# Patient Record
Sex: Male | Born: 1965 | Hispanic: Yes | Marital: Married
Health system: Southern US, Community
[De-identification: ages and names within clinical notes are randomized; demographics above are authoritative.]

---

## 2019-11-18 ENCOUNTER — Inpatient Hospital Stay: Payer: Medicaid Other

## 2019-11-18 ENCOUNTER — Emergency Department: Payer: Medicaid Other

## 2019-11-18 ENCOUNTER — Inpatient Hospital Stay
Admission: EM | Admit: 2019-11-18 | Discharge: 2019-11-24 | DRG: 296 | Disposition: E | Payer: Medicaid Other | Attending: Internal Medicine | Admitting: Internal Medicine

## 2019-11-18 DIAGNOSIS — I469 Cardiac arrest, cause unspecified: Secondary | ICD-10-CM | POA: Diagnosis present

## 2019-11-18 DIAGNOSIS — Z20822 Contact with and (suspected) exposure to covid-19: Secondary | ICD-10-CM | POA: Diagnosis present

## 2019-11-18 DIAGNOSIS — E870 Hyperosmolality and hypernatremia: Secondary | ICD-10-CM | POA: Diagnosis present

## 2019-11-18 DIAGNOSIS — R9431 Abnormal electrocardiogram [ECG] [EKG]: Secondary | ICD-10-CM | POA: Diagnosis present

## 2019-11-18 DIAGNOSIS — E872 Acidosis: Secondary | ICD-10-CM | POA: Diagnosis present

## 2019-11-18 DIAGNOSIS — N179 Acute kidney failure, unspecified: Secondary | ICD-10-CM | POA: Diagnosis present

## 2019-11-18 DIAGNOSIS — T68XXXA Hypothermia, initial encounter: Secondary | ICD-10-CM | POA: Diagnosis present

## 2019-11-18 DIAGNOSIS — J9602 Acute respiratory failure with hypercapnia: Secondary | ICD-10-CM | POA: Diagnosis present

## 2019-11-18 DIAGNOSIS — Z21 Asymptomatic human immunodeficiency virus [HIV] infection status: Secondary | ICD-10-CM | POA: Diagnosis present

## 2019-11-18 DIAGNOSIS — J690 Pneumonitis due to inhalation of food and vomit: Secondary | ICD-10-CM | POA: Diagnosis present

## 2019-11-18 DIAGNOSIS — I255 Ischemic cardiomyopathy: Secondary | ICD-10-CM | POA: Diagnosis present

## 2019-11-18 DIAGNOSIS — Z66 Do not resuscitate: Secondary | ICD-10-CM | POA: Diagnosis not present

## 2019-11-18 DIAGNOSIS — R739 Hyperglycemia, unspecified: Secondary | ICD-10-CM | POA: Diagnosis present

## 2019-11-18 DIAGNOSIS — Z681 Body mass index (BMI) 19 or less, adult: Secondary | ICD-10-CM

## 2019-11-18 DIAGNOSIS — D62 Acute posthemorrhagic anemia: Secondary | ICD-10-CM | POA: Diagnosis present

## 2019-11-18 DIAGNOSIS — K7290 Hepatic failure, unspecified without coma: Secondary | ICD-10-CM | POA: Diagnosis present

## 2019-11-18 DIAGNOSIS — I959 Hypotension, unspecified: Secondary | ICD-10-CM

## 2019-11-18 DIAGNOSIS — G939 Disorder of brain, unspecified: Secondary | ICD-10-CM

## 2019-11-18 DIAGNOSIS — E43 Unspecified severe protein-calorie malnutrition: Secondary | ICD-10-CM | POA: Diagnosis present

## 2019-11-18 DIAGNOSIS — G9341 Metabolic encephalopathy: Secondary | ICD-10-CM | POA: Diagnosis present

## 2019-11-18 DIAGNOSIS — I5021 Acute systolic (congestive) heart failure: Secondary | ICD-10-CM | POA: Diagnosis present

## 2019-11-18 DIAGNOSIS — X58XXXA Exposure to other specified factors, initial encounter: Secondary | ICD-10-CM | POA: Diagnosis present

## 2019-11-18 DIAGNOSIS — K922 Gastrointestinal hemorrhage, unspecified: Secondary | ICD-10-CM | POA: Diagnosis present

## 2019-11-18 DIAGNOSIS — Z515 Encounter for palliative care: Secondary | ICD-10-CM | POA: Diagnosis not present

## 2019-11-18 DIAGNOSIS — Z79899 Other long term (current) drug therapy: Secondary | ICD-10-CM

## 2019-11-18 DIAGNOSIS — K219 Gastro-esophageal reflux disease without esophagitis: Secondary | ICD-10-CM | POA: Diagnosis present

## 2019-11-18 DIAGNOSIS — R570 Cardiogenic shock: Secondary | ICD-10-CM | POA: Diagnosis present

## 2019-11-18 DIAGNOSIS — Z87820 Personal history of traumatic brain injury: Secondary | ICD-10-CM

## 2019-11-18 DIAGNOSIS — Z7189 Other specified counseling: Secondary | ICD-10-CM

## 2019-11-18 DIAGNOSIS — J9601 Acute respiratory failure with hypoxia: Secondary | ICD-10-CM | POA: Diagnosis present

## 2019-11-18 DIAGNOSIS — G931 Anoxic brain damage, not elsewhere classified: Secondary | ICD-10-CM | POA: Diagnosis present

## 2019-11-18 DIAGNOSIS — G9382 Brain death: Secondary | ICD-10-CM | POA: Diagnosis present

## 2019-11-18 DIAGNOSIS — D696 Thrombocytopenia, unspecified: Secondary | ICD-10-CM | POA: Diagnosis present

## 2019-11-18 DIAGNOSIS — Z9889 Other specified postprocedural states: Secondary | ICD-10-CM

## 2019-11-18 DIAGNOSIS — E875 Hyperkalemia: Secondary | ICD-10-CM | POA: Diagnosis present

## 2019-11-18 DIAGNOSIS — R578 Other shock: Secondary | ICD-10-CM | POA: Diagnosis present

## 2019-11-18 LAB — BLOOD GAS, ARTERIAL
Acid-base deficit: 23.9 mmol/L — ABNORMAL HIGH (ref 0.0–2.0)
Acid-base deficit: 10.9 mmol/L — ABNORMAL HIGH (ref 0.0–2.0)
Bicarbonate: 9.7 mmol/L — ABNORMAL LOW (ref 20.0–28.0)
Bicarbonate: 15.9 mmol/L — ABNORMAL LOW (ref 20.0–28.0)
FIO2: 1
FIO2: 1
MECHVT: 500 mL
MECHVT: 500 mL
Mechanical Rate: 16
Mechanical Rate: 18
O2 Saturation: 4.8 %
O2 Saturation: 100 %
PEEP: 10 cmH2O
PEEP: 10 cmH2O
Patient temperature: 37
Patient temperature: 37
RATE: 16 resp/min
pCO2 arterial: 53 mmHg — ABNORMAL HIGH (ref 32.0–48.0)
pCO2 arterial: 38 mmHg (ref 32.0–48.0)
pH, Arterial: 6.87 — CL (ref 7.350–7.450)
pH, Arterial: 7.23 — ABNORMAL LOW (ref 7.350–7.450)
pO2, Arterial: 13 mmHg — CL (ref 83.0–108.0)
pO2, Arterial: 470 mmHg — ABNORMAL HIGH (ref 83.0–108.0)

## 2019-11-18 LAB — CBC WITH DIFFERENTIAL/PLATELET
Abs Immature Granulocytes: 0.02 10*3/uL (ref 0.00–0.07)
Basophils Absolute: 0 10*3/uL (ref 0.0–0.1)
Basophils Relative: 0 %
Eosinophils Absolute: 0 10*3/uL (ref 0.0–0.5)
Eosinophils Relative: 0 %
HCT: 16.5 % — ABNORMAL LOW (ref 39.0–52.0)
Hemoglobin: 5.2 g/dL — ABNORMAL LOW (ref 13.0–17.0)
Immature Granulocytes: 1 %
Lymphocytes Relative: 50 %
Lymphs Abs: 2.2 10*3/uL (ref 0.7–4.0)
MCH: 31.9 pg (ref 26.0–34.0)
MCHC: 31.5 g/dL (ref 30.0–36.0)
MCV: 101.2 fL — ABNORMAL HIGH (ref 80.0–100.0)
Monocytes Absolute: 0.4 10*3/uL (ref 0.1–1.0)
Monocytes Relative: 9 %
Neutro Abs: 1.7 10*3/uL (ref 1.7–7.7)
Neutrophils Relative %: 40 %
Platelets: 96 10*3/uL — ABNORMAL LOW (ref 150–400)
RBC: 1.63 MIL/uL — ABNORMAL LOW (ref 4.22–5.81)
RDW: 17.5 % — ABNORMAL HIGH (ref 11.5–15.5)
Smear Review: DECREASED
WBC: 4.3 10*3/uL (ref 4.0–10.5)
nRBC: 0.9 % — ABNORMAL HIGH (ref 0.0–0.2)

## 2019-11-18 LAB — INFLUENZA PANEL BY PCR (TYPE A & B)
Influenza A By PCR: NEGATIVE
Influenza B By PCR: NEGATIVE

## 2019-11-18 LAB — COMPREHENSIVE METABOLIC PANEL
ALT: 149 U/L — ABNORMAL HIGH (ref 0–44)
AST: 373 U/L — ABNORMAL HIGH (ref 15–41)
Albumin: 2.5 g/dL — ABNORMAL LOW (ref 3.5–5.0)
Alkaline Phosphatase: 182 U/L — ABNORMAL HIGH (ref 38–126)
Anion gap: 19 — ABNORMAL HIGH (ref 5–15)
BUN: 44 mg/dL — ABNORMAL HIGH (ref 6–20)
CO2: 19 mmol/L — ABNORMAL LOW (ref 22–32)
Calcium: 7.8 mg/dL — ABNORMAL LOW (ref 8.9–10.3)
Chloride: 126 mmol/L — ABNORMAL HIGH (ref 98–111)
Creatinine, Ser: 2 mg/dL — ABNORMAL HIGH (ref 0.61–1.24)
GFR calc Af Amer: 43 mL/min — ABNORMAL LOW (ref >60)
GFR calc non Af Amer: 37 mL/min — ABNORMAL LOW (ref >60)
Glucose, Bld: 319 mg/dL — ABNORMAL HIGH (ref 70–99)
Potassium: 5.4 mmol/L — ABNORMAL HIGH (ref 3.5–5.1)
Sodium: 164 mmol/L (ref 135–145)
Total Bilirubin: 0.9 mg/dL (ref 0.3–1.2)
Total Protein: 5.3 g/dL — ABNORMAL LOW (ref 6.5–8.1)

## 2019-11-18 LAB — POC SARS CORONAVIRUS 2 AG: SARS Coronavirus 2 Ag: NEGATIVE

## 2019-11-18 LAB — GLUCOSE, CAPILLARY
Glucose-Capillary: 238 mg/dL — ABNORMAL HIGH (ref 70–99)
Glucose-Capillary: 354 mg/dL — ABNORMAL HIGH (ref 70–99)

## 2019-11-18 LAB — MRSA PCR SCREENING: MRSA by PCR: NEGATIVE

## 2019-11-18 LAB — ABO/RH: ABO/RH(D): A POS

## 2019-11-18 LAB — TROPONIN I (HIGH SENSITIVITY): Troponin I (High Sensitivity): 124 ng/L (ref <18)

## 2019-11-18 MED ORDER — INSULIN ASPART 100 UNIT/ML IV SOLN
10.0000 [IU] | INTRAVENOUS | Status: AC
  Administered 2019-11-18: 10 [IU] via INTRAVENOUS
  Filled 2019-11-18: qty 0.1

## 2019-11-18 MED ORDER — HEPARIN SODIUM (PORCINE) 5000 UNIT/ML IJ SOLN: 5000.0000 [IU] | Freq: Three times a day (TID) | INTRAMUSCULAR | Status: DC

## 2019-11-18 MED ORDER — SODIUM CHLORIDE 0.9 % IV SOLN: 250.0000 mL | INTRAVENOUS | Status: DC | PRN

## 2019-11-18 MED ORDER — PANTOPRAZOLE SODIUM 40 MG IV SOLR
40.0000 mg | INTRAVENOUS | Status: AC
  Filled 2019-11-18: qty 40

## 2019-11-18 MED ORDER — SODIUM CHLORIDE 0.9% IV SOLUTION: Freq: Once | INTRAVENOUS | Status: DC

## 2019-11-18 MED ORDER — ALBUTEROL SULFATE (2.5 MG/3ML) 0.083% IN NEBU: 2.5000 mg | INHALATION_SOLUTION | RESPIRATORY_TRACT | Status: DC | PRN

## 2019-11-18 MED ORDER — EPINEPHRINE PF 1 MG/ML IJ SOLN
1.0000 mg | Freq: Once | INTRAMUSCULAR | Status: AC
  Administered 2019-11-18: 1 mg via INTRAVENOUS

## 2019-11-18 MED ORDER — DEXTROSE 50 % IV SOLN: 12.5000 g | INTRAVENOUS | Status: AC

## 2019-11-18 MED ORDER — ONDANSETRON HCL 4 MG/2ML IJ SOLN: 4.0000 mg | Freq: Four times a day (QID) | INTRAMUSCULAR | Status: DC | PRN

## 2019-11-18 MED ORDER — INSULIN ASPART 100 UNIT/ML ~~LOC~~ SOLN
0.0000 [IU] | SUBCUTANEOUS | Status: DC
  Administered 2019-11-19: 1 [IU] via SUBCUTANEOUS
  Administered 2019-11-19: 2 [IU] via SUBCUTANEOUS
  Filled 2019-11-18 (×2): qty 1

## 2019-11-18 MED ORDER — VASOPRESSIN 20 UNIT/ML IV SOLN
0.0300 [IU]/min | INTRAVENOUS | Status: DC
  Administered 2019-11-18: 0.03 [IU]/min via INTRAVENOUS
  Filled 2019-11-18: qty 2

## 2019-11-18 MED ORDER — SODIUM CHLORIDE 0.9 % IV SOLN: 250.0000 mL | INTRAVENOUS | Status: DC

## 2019-11-18 MED ORDER — STERILE WATER FOR INJECTION IV SOLN: INTRAVENOUS | Status: DC

## 2019-11-18 MED ORDER — FREE WATER
200.0000 mL | Status: DC
  Administered 2019-11-19 (×4): 200 mL

## 2019-11-18 MED ORDER — VITAMIN K1 10 MG/ML IJ SOLN
10.0000 mg | Freq: Once | INTRAVENOUS | Status: AC
  Administered 2019-11-19: 10 mg via INTRAVENOUS
  Filled 2019-11-18: qty 1

## 2019-11-18 MED ORDER — SODIUM CHLORIDE 0.9 % IV SOLN
3.0000 g | Freq: Once | INTRAVENOUS | Status: DC
  Filled 2019-11-18: qty 8

## 2019-11-18 MED ORDER — FAMOTIDINE IN NACL 20-0.9 MG/50ML-% IV SOLN: 20.0000 mg | Freq: Two times a day (BID) | INTRAVENOUS | Status: DC

## 2019-11-18 MED ORDER — SODIUM CHLORIDE 0.9 % IV SOLN: 10.0000 mL/h | Freq: Once | INTRAVENOUS | Status: DC

## 2019-11-18 MED ORDER — SODIUM BICARBONATE 8.4 % IV SOLN
50.0000 mL | Freq: Once | INTRAVENOUS | Status: AC
  Administered 2019-11-18: 50 mL via INTRAVENOUS

## 2019-11-18 MED ORDER — PIPERACILLIN-TAZOBACTAM 3.375 G IVPB
3.3750 g | Freq: Three times a day (TID) | INTRAVENOUS | Status: DC
  Administered 2019-11-18 – 2019-11-19 (×2): 3.375 g via INTRAVENOUS
  Filled 2019-11-18 (×2): qty 50

## 2019-11-18 MED ORDER — SODIUM CHLORIDE 0.9% FLUSH: 3.0000 mL | INTRAVENOUS | Status: DC | PRN

## 2019-11-18 MED ORDER — STERILE WATER FOR INJECTION IV SOLN
INTRAVENOUS | Status: DC
  Filled 2019-11-18 (×4): qty 850

## 2019-11-18 MED ORDER — PHENYLEPHRINE CONCENTRATED 100MG/250ML (0.4 MG/ML) INFUSION SIMPLE
0.0000 ug/min | INTRAVENOUS | Status: DC
  Administered 2019-11-19: 200 ug/min via INTRAVENOUS
  Administered 2019-11-19: 100 ug/min via INTRAVENOUS
  Administered 2019-11-19: 200 ug/min via INTRAVENOUS
  Filled 2019-11-18 (×3): qty 250

## 2019-11-18 MED ORDER — SODIUM CHLORIDE 0.9 % IV BOLUS
1000.0000 mL | Freq: Once | INTRAVENOUS | Status: AC
  Administered 2019-11-18: 1000 mL via INTRAVENOUS

## 2019-11-18 MED ORDER — NOREPINEPHRINE 4 MG/250ML-% IV SOLN
2.0000 ug/min | INTRAVENOUS | Status: DC
  Administered 2019-11-18: 2 ug/min via INTRAVENOUS
  Filled 2019-11-18: qty 250

## 2019-11-18 MED ORDER — ACETAMINOPHEN 325 MG PO TABS: 650.0000 mg | ORAL_TABLET | ORAL | Status: DC | PRN

## 2019-11-18 MED ORDER — SODIUM CHLORIDE 0.9% FLUSH
3.0000 mL | Freq: Two times a day (BID) | INTRAVENOUS | Status: DC
  Administered 2019-11-19: 3 mL via INTRAVENOUS

## 2019-11-18 MED ORDER — VASOPRESSIN 20 UNIT/ML IV SOLN
0.2000 [IU]/min | INTRAVENOUS | Status: DC
  Administered 2019-11-19 (×2): 0.8 [IU]/min via INTRAVENOUS
  Administered 2019-11-19: 0.6 [IU]/min via INTRAVENOUS
  Filled 2019-11-18 (×4): qty 5

## 2019-11-18 MED ORDER — SODIUM BICARBONATE 8.4 % IV SOLN
100.0000 meq | Freq: Once | INTRAVENOUS | Status: AC
  Administered 2019-11-18: 100 meq via INTRAVENOUS

## 2019-11-18 MED ORDER — NOREPINEPHRINE 16 MG/250ML-% IV SOLN
0.0000 ug/min | INTRAVENOUS | Status: DC
  Administered 2019-11-18: 30 ug/min via INTRAVENOUS
  Administered 2019-11-19: 50 ug/min via INTRAVENOUS
  Administered 2019-11-19: 40 ug/min via INTRAVENOUS
  Administered 2019-11-19: 50 ug/min via INTRAVENOUS
  Filled 2019-11-18 (×5): qty 250

## 2019-11-18 MED ORDER — POLYVINYL ALCOHOL 1.4 % OP SOLN
2.0000 [drp] | Freq: Four times a day (QID) | OPHTHALMIC | Status: DC | PRN
  Filled 2019-11-18: qty 15

## 2019-11-18 MED ORDER — CALCIUM CHLORIDE 10 % IV SOLN
1.0000 g | Freq: Once | INTRAVENOUS | Status: AC
  Administered 2019-11-18: 1 g via INTRAVENOUS

## 2019-11-18 MED ORDER — SODIUM CHLORIDE 0.9 % IV SOLN
3.0000 g | Freq: Four times a day (QID) | INTRAVENOUS | Status: DC
  Filled 2019-11-18 (×3): qty 8

## 2019-11-18 MED ORDER — SODIUM CHLORIDE 0.9 % IV SOLN
8.0000 mg/h | INTRAVENOUS | Status: DC
  Administered 2019-11-19 (×2): 8 mg/h via INTRAVENOUS
  Filled 2019-11-18 (×2): qty 80

## 2019-11-18 NOTE — H&P (Addendum)
Name: Brent Levy MRN: [id number] DOB: [birth year]     CONSULTATION DATE: Dec 13, 2019  REFERRING MD :  Derrill Kay  CHIEF COMPLAINT:  Cardiac arrest  HISTORY OF PRESENT ILLNESS:   54 yo male with h/o  HIV (dx 09/2011), Esophageal Candidiasis, Polysubstance Abuse ("crack cocaine"), GERD, HIV, Pneumonia questionable PCP, Severe Calorie Malnutrition, TBI requiring right decompressive craniectomy for evacuation of a subdural hematoma and frontal contusion required PEG tube and tracheostomy secondary to falling off a ladder in (Oct. 15 2020 at baseline he occasionally speaks and follows some commands, he also has left eye blindness).  He presented to [facility] ER on 02/23 following an out of hospital cardiac arrest.  Per ER notes pts family reported the pt initially developed shortness of breath followed by unresponsiveness and pulselessness requiring EMS notification.  Upon EMS arrival pt asystole and pulseless requiring multiple rounds of CPR/ACLS protocol with ROSC prior to transport, and again cardiac arrested again en route to [facility] ER.  Upon arrival to the ER pt unresponsive with ETT in place.  Pt lost his pulse again requiring multiple rounds of CPR prior to ROSC (see code sheet for details).  Initial ABG revealed significant acidosis and active bleeding per ETT and orally, therefore massive blood transfusion protocol initiated and pt received 2 units of pRBC's.  Pt also significantly hypotensive requiring levophed and sodium bicarb gtts.  He was subsequently admitted to ICU for additional workup and treatment.  Pt deemed NOT a hypothermic protocol candidate due to prolonged downtime TOTAL CPR greater than 1 hour  SIGNIFICANT EVENTS/RESULTS: 02/23: Pt admitted to ICU mechanically intubated requiring levophed gtt upon arrival due to severe hypotension vasopressin gtt initiated  02/23: CT Head revealed the patient is status post right craniectomy. There are some findings felt related to evolving  posttraumatic changes at the bilateral frontal, right parietal and right temporal lobes. However there is generalized loss of the gray-white matter differentiation in addition to diffuse bilateral sulcal effacement, suspect for acute diffuse brain swelling and probable global hypoxic ischemic injury. About 4 mm midline shift to the right with bulging of dura and brain contents through the right craniectomy site. No hemorrhage is visualized. Incompletely visualized fractures involving the left frontal bone, bilateral orbital roofs, and anterior ethmoid sinuses. Contacted neurosurgery Dr. Adriana Simas he reviewed CT Head imaging and stated it did not appear the pt had significant brain swelling recommended tele-neurology consultation.   02/23: Tele-neurology consulted recommended if able to stabilize pt obtain EEG and MR Brain to assist with prognosis   PAST MEDICAL HISTORY :  HIV care.  T-Helper Lymphs  Date Value Ref Range Status  05/01/2019 0.13 (L) 0.31 - 3.11 X1000/uL Final  03/16/2015 <0.01 (L) 0.31 - 3.11 X1000 Final  and viral load is:  HIV, ULTRAQUANT   Prior to Admission medications   Medication Sig Start Date End Date Taking? Authorizing Provider  acetaminophen (TYLENOL) 325 MG tablet Take 650 mg by mouth every 6 (six) hours as needed for mild pain or fever.   Yes [provider]  albuterol (PROVENTIL) (2.5 MG/3ML) 0.083% nebulizer solution Take 2.5 mg by nebulization every 4 (four) hours as needed for wheezing or shortness of breath.   Yes [provider]  Banana Flakes (BANATROL PLUS) PACK Take 1 Dose by mouth 3 (three) times daily.   Yes [provider]  chlorhexidine (PERIDEX) 0.12 % solution Use as directed 15 mLs in the mouth or throat 2 (two) times daily.   Yes [provider]  Cobicistat (TYBOST) 150 MG TABS Take 150 mg by mouth daily with breakfast.   Yes [provider]  darunavir (PREZISTA) 800 MG tablet Take 800 mg by mouth daily with  breakfast.   Yes [provider]  dolutegravir (TIVICAY) 50 MG tablet Take 50 mg by mouth 2 (two) times daily.   Yes [provider]  gabapentin (NEURONTIN) 300 MG/6ML solution Take 100 mg by mouth 3 (three) times daily.   Yes [provider]  guaifenesin (ROBITUSSIN) 100 MG/5ML syrup Take 400 mg by mouth 3 (three) times daily as needed for cough.   Yes [provider]  polyvinyl alcohol (LIQUIFILM TEARS) 1.4 % ophthalmic solution Place 2 drops into both eyes every 6 (six) hours as needed for dry eyes.   Yes [provider]  sulfamethoxazole-trimethoprim (BACTRIM DS) 800-160 MG tablet Take 1 tablet by mouth daily.   Yes [provider]  zidovudine (RETROVIR) 300 MG tablet Take 300 mg by mouth 2 (two) times daily.   Yes [provider]   No Known Allergies  FAMILY HISTORY:  family history is not on file. SOCIAL HISTORY:   REVIEW OF SYSTEMS:   Unable to obtain due to critical illness  SUBJECTIVE: Unable to obtain pt mechanically intubated  VITAL SIGNS: Temp:  [93.7 F (34.3 C)-97.6 F (36.4 C)] 94 F (34.4 C) (02/23 1810) Pulse Rate:  [70-123] 94 (02/23 1810) Resp:  [18-63] 19 (02/23 1810) BP: (44-154)/(31-72) 92/72 (02/23 1810) SpO2:  [79 %-98 %] 83 % (02/23 1810) FiO2 (%):  [100 %] 100 % (02/23 1745) Weight:  [63.5 kg] 63.5 kg (02/23 1723)  No intake/output data recorded. No intake/output data recorded.  SpO2: (!) 83 % O2 Flow Rate (L/min): 44 L/min FiO2 (%): 100 %  Physical Examination:  GENERAL:critically ill appearing male on NO sedation unresponsive, mechanically intubated  HEAD: Normocephalic, atraumatic.  EYES: left pupil dilated 4 mm non-reactive and right pupil 3 mm non-reactive, not following commands, unresponsive, no gag or corneal reflexes present  MOUTH: active oral and ETT bleeding  NECK: supple. no JVD.  PULMONARY: faint rhonchi throughout, slightly labored  CARDIOVASCULAR: S1 and S2. Regular  rate and rhythm, no R/G GASTROINTESTINAL: hypoactive BS x4, left quadrant PEG tube, soft, non distended    MUSCULOSKELETAL: no edema.  NEUROLOGIC: not following commands, unresponsive, no gag or corneal reflexes present  SKIN: intact,warm,dry  I personally reviewed lab work that was obtained in last 24 hrs. CXR Independently reviewed-b/l infiltrates  MEDICATIONS: I have reviewed all medications and confirmed regimen as documented  CULTURE RESULTS   No results found for this or any previous visit (from the past 240 hour(s)).        IMAGING    DG Chest 1 View  Result Date: 10/28/2019 CLINICAL DATA:  Intubation EXAM: CHEST  1 VIEW COMPARISON:  None. FINDINGS: Endotracheal tube in good position. Bilateral airspace disease consistent with pneumonia. No effusion. No pneumothorax. IMPRESSION: Endotracheal tube in good position. Diffuse bilateral pneumonia. Electronically Signed   By: Franchot Gallo M.D.   On: 11/15/2019 18:02        Indwelling Urinary Catheter continued, requirement due to   Reason to continue Indwelling Urinary Catheter strict Intake/Output monitoring for hemodynamic instability         Ventilator continued, requirement due to severe respiratory failure   Ventilator Sedation RASS -1 to -2 for now     ASSESSMENT AND PLAN SYNOPSIS  Severe acute hypoxic and hypercapnic respiratory failure suspected secondary to aspiration followed  by cardiac arrest (asystole) from aspiration pneumonia/probable acute sCHF Full vent support for now-vent settings reviewed and established  VAP bundle implemented Prn bronchodilator therapy   Shock: hemorrhagic/cardiogenic  Cardiac arrest (asytole)-possibly caused by respiratory arrest  Pt NOT a hypothermic protocol candidate due to prolonged downtime  Continuous telemetry monitoring  Trend troponin's Aggressive blood product/fluid resuscitation and prn levophed/vasopressin/neo-synephrine gtts  Echo pending   Acute renal  failure with hyperkalemia secondary to hypotension  Hypernatremia  Severe metabolic acidosis  Trend BMP  Replace electrolytes as indicated  Monitor UOP Avoid nephrotoxic medications  Sodium bicarb gtt @150  ml/hr for now   Pneumonia (aspiration and possible CAP) HIV+ Trend WBC and monitor fever curve Follow cultures Will discontinue unasyn and start zosyn for broader coverage  ID consulted for HIV medication management appreciate input   Acute blood loss anemia secondary to GI bleed  Thrombocytopenia  Massive blood transfusion protocol initiated  Serial CBC's  Monitor for s/sx and transfuse to maintain hgb >7 and/or s/sx of bleeding   Transiminitis secondary to hypotension  Trend hepatic panel   Hyperglycemia  CBG's q4hrs  SSI   Acute metabolic encephalopathy and CT Head results suspicious for anoxic brain injury secondary to prolonged downtime  Avoid sedating medications when able Maintain RASS goal 0 to -1 Tele-Neurology consulted: recommending if able to stabilize pt obtain MR Brain and EEG  [facility] Neurology consulted   Best Practice: VTE px: SCD's; avoid chemical prophylaxis  SUP px: IV protonix gtt  Diet: NPO   Overall, patient is critically ill, prognosis is guarded.  Patient with Multiorgan failure and at high risk for cardiac arrest and death.   OVERALL PROGNOSIS IS POOR PATIENT WITH VERY POOR CHANCE OF MEANINGFUL RECOVERY RECOMMEND DNR STATUS PALLIATIVE CARE CONSULTED TO ASSIST WITH GOALS OF TREATMENT    [facility], AGNP  Pulmonary/Critical Care Pager [phone] (please enter 7 digits) PCCM Consult Pager [phone] (please enter 7 digits)

## 2019-11-18 NOTE — Progress Notes (Signed)
Pharmacy Antibiotic Note  Brent Levy is a 54 y.o. male admitted on 2019-12-04 with pneumonia.  Pharmacy has been consulted for zosyn dosing.  Plan: Zosyn 3.375g IV q8h (4 hour infusion).  Will continue to monitor.  Height: 5\' 8"  (172.7 cm) Weight: 140 lb (63.5 kg) IBW/kg (Calculated) : 68.4  Temp (24hrs), Avg:93.4 F (34.1 C), Min:92.3 F (33.5 C), Max:97.6 F (36.4 C)  Recent Labs  Lab 12/04/2019 2119  WBC 4.3  CREATININE 2.00*    Estimated Creatinine Clearance: 38.8 mL/min (A) (by C-G formula based on SCr of 2 mg/dL (H)).    No Known Allergies  Thank you for allowing pharmacy to be a part of this patient's care.  [id number], PharmD, BCPS Clinical Pharmacist 2019-12-04 10:26 PM

## 2019-11-18 NOTE — ED Notes (Addendum)
No pulse, cpr resumed 

## 2019-11-18 NOTE — ED Notes (Addendum)
Pulse check, pulse found

## 2019-11-18 NOTE — ED Notes (Signed)
Pt received 6 epi, 1 bicarb, and 1 atropine en rote via ems. Pt initial bp was 80/50, on arrival was 60/40

## 2019-11-18 NOTE — ED Notes (Signed)
Bicarb in. Pulse check

## 2019-11-18 NOTE — ED Notes (Addendum)
CPR started.

## 2019-11-18 NOTE — ED Notes (Signed)
2nd unit blood into L EJ

## 2019-11-18 NOTE — ED Notes (Signed)
Emergency blood into L EJ

## 2019-11-18 NOTE — ED Notes (Signed)
4th epi in

## 2019-11-18 NOTE — ED Provider Notes (Signed)
[facility] Emergency Department Provider Note  ____________________________________________   I have reviewed the triage vital signs and the nursing notes.   HISTORY  Chief Complaint Cardiac Arrest   History limited by and level 5 caveat due to: unresponsive   HPI Rockney Graison Leinberger is a 54 y.o. male who presents to the emergency department today via EMS as a post cardiac arrest patient. Patient cannot give any history. The patient apparently suffered a significant traumatic brain injury roughly 4 months ago that required removal of part of the frontal lobe. Per report the patient was at home when family noticed that he started having difficulty breathing. He then went unresponsive and lost pulses. When first responders came on scene the patient was asystolic. After multiple rounds of CPR they did regain pulses. The patient lost pulses once again during transport however EMS was able to regain pulses.   Records reviewed. Per medical record review patient has a history of traumatic brain injury.    Patient Active Problem List   Diagnosis Date Noted  . Cardiac arrest (HCC) 11/22/2019    Prior to Admission medications   Medication Sig Start Date End Date Taking? Authorizing Provider  acetaminophen (TYLENOL) 325 MG tablet Take 650 mg by mouth every 6 (six) hours as needed for mild pain or fever.   Yes [provider]  albuterol (PROVENTIL) (2.5 MG/3ML) 0.083% nebulizer solution Take 2.5 mg by nebulization every 4 (four) hours as needed for wheezing or shortness of breath.   Yes [provider]  Banana Flakes (BANATROL PLUS) PACK Take 1 Dose by mouth 3 (three) times daily.   Yes [provider]  chlorhexidine (PERIDEX) 0.12 % solution Use as directed 15 mLs in the mouth or throat 2 (two) times daily.   Yes [provider]  Cobicistat (TYBOST) 150 MG TABS Take 150 mg by mouth daily with breakfast.   Yes [provider]   darunavir (PREZISTA) 800 MG tablet Take 800 mg by mouth daily with breakfast.   Yes [provider]  dolutegravir (TIVICAY) 50 MG tablet Take 50 mg by mouth 2 (two) times daily.   Yes [provider]  gabapentin (NEURONTIN) 300 MG/6ML solution Take 100 mg by mouth 3 (three) times daily.   Yes [provider]  guaifenesin (ROBITUSSIN) 100 MG/5ML syrup Take 400 mg by mouth 3 (three) times daily as needed for cough.   Yes [provider]  polyvinyl alcohol (LIQUIFILM TEARS) 1.4 % ophthalmic solution Place 2 drops into both eyes every 6 (six) hours as needed for dry eyes.   Yes [provider]  sulfamethoxazole-trimethoprim (BACTRIM DS) 800-160 MG tablet Take 1 tablet by mouth daily.   Yes [provider]  zidovudine (RETROVIR) 300 MG tablet Take 300 mg by mouth 2 (two) times daily.   Yes [provider]    Allergies Patient has no known allergies.  No family history on file.  Social History Social History   Tobacco Use  . Smoking status: Not on file  Substance Use Topics  . Alcohol use: Not on file  . Drug use: Not on file    Review of Systems Unable to obtain secondary to medical condition. ____________________________________________   PHYSICAL EXAM:  VITAL SIGNS: ED Triage Vitals  Enc Vitals Group     BP 10/28/2019 1722 (!) 46/31     Pulse Rate 10/31/2019 1722 (!) 104     Resp 10/31/2019 1722 (!) 24     Temp 11/01/2019  1727 97.6 F (36.4 C)     Temp Source 11/01/2019 1727 Rectal     SpO2 11/07/2019 1722 95 %     Weight 11/06/2019 1723 140 lb (63.5 kg)     Height 11/03/2019 1723 5\' 8"  (1.727 m)   Constitutional: Unresponsive. ET tube in place. Eyes: Pupils dilated and unresponsive.  ENT      Head: Sequela of brain surgery present      Nose: Nasal trumpet in place in right nares.       Mouth/Throat: Mucous membranes are moist.      Neck: No stridor. Cardiovascular: Tachycardic, regular rhythm.  No murmurs, rubs, or  gallops.  Respiratory: ET tube in place. Ventilated.  Gastrointestinal: Soft and non tender. No rebound. No guarding.  Genitourinary: Deferred Musculoskeletal: No deformity. No edema.  Neurologic:  Unresponsive.  Skin:  Skin is warm, dry and intact. No rash noted. ____________________________________________    LABS (pertinent positives/negatives)  CBG 354 ABG pH 6.87  ____________________________________________   EKG  I, , attending physician, personally viewed and interpreted this EKG  EKG Time: 1722 Rate: 104 Rhythm: sinus tachycardia Axis: normal Intervals: qtc 406 QRS: low voltage precordial leads ST changes: no st elevation Impression: abnormal ekg  ____________________________________________    RADIOLOGY  CXR ET tube in appropriate position  ____________________________________________   PROCEDURES  Procedures  CRITICAL CARE Performed by: 1723   Total critical care time: 75 minutes  Critical care time was exclusive of separately billable procedures and treating other patients.  Critical care was necessary to treat or prevent imminent or life-threatening deterioration.  Critical care was time spent personally by me on the following activities: development of treatment plan with patient and/or surrogate as well as nursing, discussions with consultants, evaluation of patient's response to treatment, examination of patient, obtaining history from patient or surrogate, ordering and performing treatments and interventions, ordering and review of laboratory studies, ordering and review of radiographic studies, pulse oximetry and re-evaluation of patient's condition.  ____________________________________________   INITIAL IMPRESSION / ASSESSMENT AND PLAN / ED COURSE  Pertinent labs & imaging results that were available during my care of the patient were reviewed by me and considered in my medical decision making (see chart for  details).   Patient presented to the emergency department today after cardiac arrest.  EMS did regain pulses in the field however lost them again during transport only to regain them once more.  Upon arrival to the emergency department the patient was unresponsive and ET tube was in place.  Initial heart rate was tachycardic.  However while initially evaluated the patient here in the emergency department his heart rate did slow down to the 50s and then the 40s.  At this point pulses were lost.  We did initiate CPR here in the emergency department.  After multiple rounds of CPR we did regain pulses.  Please see nursing documentation for exact medications given.  Initial ABG was concerning for significant acidosis.  However blood work did not return in a timely manner here in the emergency department.  Visualizing the blood it did appear very thin.  Additionally he did have pale mucosal membranes.  I did have concern for significant anemia so emergency blood was given.  Additionally he was given IV fluids and pressors were started given hypotension.  The patient was admitted to the ICU service.  ____________________________________________   FINAL CLINICAL IMPRESSION(S) / ED DIAGNOSES  Final diagnoses:  Cardiopulmonary arrest (HCC)  Hypothermia of newborn  Hypotension, unspecified hypotension type     Note: This dictation was prepared with Dragon dictation. Any transcriptional errors that result from this process are unintentional     Nance Pear, MD 12-02-2019 2144

## 2019-11-18 NOTE — ED Notes (Signed)
Personal belongings bag sent with pt spouse

## 2019-11-18 NOTE — Consult Note (Signed)
[facility] TeleNeurology Consult Services  Stat Consult  Date of Service:   11/21/2019 21:37:06  Impression:     .  G93.1 - Anoxic brain damage, not elsewhere classified  Comments/Sign-Out: unresponsiveness with loss of all brainstem reflexes s/p multiple episodes of cardiac arrest (with one episode lasting > 1 hour) and CT head suspicious for anoxic brain injury. Given exam, prognosis is extremely poor.  CT HEAD: Reviewed The patient is status post right craniectomy. There are some findings felt related to evolving posttraumatic changes at the bilateral frontal, right parietal and right temporal lobes. However there is generalized loss of the gray-white matter differentiation in addition to diffuse bilateral sulcal effacement, suspect for acute diffuse brain swelling and probable global hypoxic ischemic injury. About 4 mm midline shift to the right with bulging of dura and brain contents through the right craniectomy site. No hemorrhage is visualized.  Metrics: [facility] Notification Time: 11/01/2019 21:33:22 Stamp Time: 11/08/2019 21:37:06 Callback Response Time: 10/31/2019 21:37:59  Our recommendations are outlined below.  Recommendations:     .  EEG   Imaging Studies:     .  MRI Head Without Contrast  Disposition: Neurology Follow Up Recommended  Sign Out:     .  Discussed with Primary Attending  ----------------------------------------------------------------------------------------------------  Chief Complaint: anoxic brain injury  History of Present Illness: Patient is a 54 year old Male.  53 year old man with history of SDH in Oct 2020 s/p craniectomy presents with prolonged cardiac arrest in the field lasting for > 1 hour. He required multiple rounds of CPR and ACLS medications. On his way to the hospital and in the ED, he had further episodes of cardiac arrest. Prior to admission, he had SOB and the primary team questioned  whether he aspirated at home. CT head showed possible hypoxic ischemic injury. Per the primary team, he is not on sedation and is unresponsiveness, with unequal and unreactive pupils. he also has an active GI bleed.           Examination: BP(74/55), Pulse(117), Blood Glucose(319)  Neuro Exam:     Mental Status: Intubated, not sedated. No responsive. Does not follow commands. Cranial Nerves: per primary team, pupils unresponsive to light. No corneal reflexes B/L and no gag/cough. No gaze deviation. Motor and Sensory Exam: No movement in any extremity even with pain.    Due to the immediate potential for life-threatening deterioration due to underlying acute neurologic illness, I spent 25 minutes providing critical care. This time includes time for face to face visit via telemedicine, review of medical records, imaging studies and discussion of findings with providers, the patient and/or family.   Dr Vena Austria   [facility] [phone]  Case [id number]

## 2019-11-18 NOTE — ED Triage Notes (Signed)
Pt arrives from home via [facility] with complaint of cardiac arrest following foaming at the mouth. Pt had partial frontal lobe removal several months ago. Pt was alert this am, found pulseless asystole by ems. CPR initiated, pulse regained twice and lost again. Pulse on arrival, lost shortly after.

## 2019-11-18 NOTE — ED Notes (Signed)
ET tube in place per ems

## 2019-11-18 NOTE — Procedures (Signed)
Central Venous Catheter Insertion Procedure Note Ermine Spofford [id number] 12-Feb-1966  Procedure: Insertion of Central Venous Catheter Indications: Assessment of intravascular volume, Drug and/or fluid administration and Frequent blood sampling  Procedure Details Consent: Unable to obtain consent because of emergent medical necessity. Time Out: Verified patient identification, verified procedure, site/side was marked, verified correct patient position, special equipment/implants available, medications/allergies/relevent history reviewed, required imaging and test results available.  Performed  Maximum sterile technique was used including antiseptics, cap, gloves, gown, hand hygiene, mask and sheet. Skin prep: Chlorhexidine; local anesthetic administered A antimicrobial bonded/coated triple lumen catheter was placed in the left femoral vein due to emergent situation using the Seldinger technique.  Evaluation Blood flow good Complications: No apparent complications Patient did tolerate procedure well. Chest X-ray ordered to verify placement.  CXR: not indicated .  Left femoral central line placed emergently utilizing ultrasound due to pt actively bleeding and requirement of multiple pressors.  Pt tolerated procedure and no complications noted during or following procedure.  Sonda Rumble, AGNP  Pulmonary/Critical Care Pager [phone] (please enter 7 digits) PCCM Consult Pager [phone] (please enter 7 digits)

## 2019-11-18 NOTE — Progress Notes (Signed)
Chaplain provided supportive presence to family while Taz was in Emergency Department due to Cardiac Arrest. Family (wife and son, 53 years old) benefited from prayer, emotional support and bereavement care. Family speaks Spanish and benefited from Interpreter services. Son is spokes person for family. Two other children live in the area who were disappointed to not be able to be with their father. The son was able to facetime with them while in the ED. Son anticipates return at 10am. Chaplain verified family contact information in the system and educated family on how to access nurse. Chaplain spoke with ICU nurse to convene families concern and desire to be updated when test results are available. Spiritual care services continue to be available upon request.     10/31/2019 2000  Clinical Encounter Type  Visited With Patient and family together  Visit Type Initial;ED;Critical Care;Spiritual support;Psychological support  Referral From Nurse  Consult/Referral To Chaplain  Spiritual Encounters  Spiritual Needs Grief support;Emotional;Prayer  Stress Factors  Patient Stress Factors Health changes

## 2019-11-19 ENCOUNTER — Inpatient Hospital Stay: Payer: Medicaid Other

## 2019-11-19 DIAGNOSIS — I469 Cardiac arrest, cause unspecified: Secondary | ICD-10-CM

## 2019-11-19 DIAGNOSIS — G931 Anoxic brain damage, not elsewhere classified: Secondary | ICD-10-CM

## 2019-11-19 DIAGNOSIS — Z515 Encounter for palliative care: Secondary | ICD-10-CM

## 2019-11-19 DIAGNOSIS — Z7189 Other specified counseling: Secondary | ICD-10-CM

## 2019-11-19 LAB — CBC WITH DIFFERENTIAL/PLATELET
Abs Immature Granulocytes: 0.01 10*3/uL (ref 0.00–0.07)
Abs Immature Granulocytes: 0.01 10*3/uL (ref 0.00–0.07)
Abs Immature Granulocytes: 0.03 10*3/uL (ref 0.00–0.07)
Basophils Absolute: 0 10*3/uL (ref 0.0–0.1)
Basophils Absolute: 0 10*3/uL (ref 0.0–0.1)
Basophils Absolute: 0 10*3/uL (ref 0.0–0.1)
Basophils Relative: 1 %
Basophils Relative: 1 %
Basophils Relative: 0 %
Eosinophils Absolute: 0 10*3/uL (ref 0.0–0.5)
Eosinophils Absolute: 0 10*3/uL (ref 0.0–0.5)
Eosinophils Absolute: 0 10*3/uL (ref 0.0–0.5)
Eosinophils Relative: 0 %
Eosinophils Relative: 0 %
Eosinophils Relative: 0 %
HCT: 33 % — ABNORMAL LOW (ref 39.0–52.0)
HCT: 31.4 % — ABNORMAL LOW (ref 39.0–52.0)
HCT: 21.5 % — ABNORMAL LOW (ref 39.0–52.0)
Hemoglobin: 10.9 g/dL — ABNORMAL LOW (ref 13.0–17.0)
Hemoglobin: 10.3 g/dL — ABNORMAL LOW (ref 13.0–17.0)
Hemoglobin: 7.4 g/dL — ABNORMAL LOW (ref 13.0–17.0)
Immature Granulocytes: 1 %
Immature Granulocytes: 1 %
Immature Granulocytes: 1 %
Lymphocytes Relative: 57 %
Lymphocytes Relative: 49 %
Lymphocytes Relative: 41 %
Lymphs Abs: 0.8 10*3/uL (ref 0.7–4.0)
Lymphs Abs: 0.8 10*3/uL (ref 0.7–4.0)
Lymphs Abs: 1 10*3/uL (ref 0.7–4.0)
MCH: 30.7 pg (ref 26.0–34.0)
MCH: 30.4 pg (ref 26.0–34.0)
MCH: 31.1 pg (ref 26.0–34.0)
MCHC: 33 g/dL (ref 30.0–36.0)
MCHC: 32.8 g/dL (ref 30.0–36.0)
MCHC: 34.4 g/dL (ref 30.0–36.0)
MCV: 93 fL (ref 80.0–100.0)
MCV: 92.6 fL (ref 80.0–100.0)
MCV: 90.3 fL (ref 80.0–100.0)
Monocytes Absolute: 0 10*3/uL — ABNORMAL LOW (ref 0.1–1.0)
Monocytes Absolute: 0.1 10*3/uL (ref 0.1–1.0)
Monocytes Absolute: 0.1 10*3/uL (ref 0.1–1.0)
Monocytes Relative: 1 %
Monocytes Relative: 3 %
Monocytes Relative: 3 %
Neutro Abs: 0.6 10*3/uL — ABNORMAL LOW (ref 1.7–7.7)
Neutro Abs: 0.7 10*3/uL — ABNORMAL LOW (ref 1.7–7.7)
Neutro Abs: 1.3 10*3/uL — ABNORMAL LOW (ref 1.7–7.7)
Neutrophils Relative %: 40 %
Neutrophils Relative %: 46 %
Neutrophils Relative %: 55 %
Platelets: 63 10*3/uL — ABNORMAL LOW (ref 150–400)
Platelets: 133 10*3/uL — ABNORMAL LOW (ref 150–400)
Platelets: 76 10*3/uL — ABNORMAL LOW (ref 150–400)
RBC: 3.55 MIL/uL — ABNORMAL LOW (ref 4.22–5.81)
RBC: 3.39 MIL/uL — ABNORMAL LOW (ref 4.22–5.81)
RBC: 2.38 MIL/uL — ABNORMAL LOW (ref 4.22–5.81)
RDW: 14.8 % (ref 11.5–15.5)
RDW: 15 % (ref 11.5–15.5)
RDW: 15.6 % — ABNORMAL HIGH (ref 11.5–15.5)
Smear Review: NORMAL
WBC: 1.4 10*3/uL — CL (ref 4.0–10.5)
WBC: 1.6 10*3/uL — ABNORMAL LOW (ref 4.0–10.5)
WBC: 2.5 10*3/uL — ABNORMAL LOW (ref 4.0–10.5)
nRBC: 0 % (ref 0.0–0.2)
nRBC: 0 % (ref 0.0–0.2)
nRBC: 0 % (ref 0.0–0.2)

## 2019-11-19 LAB — BPAM FFP
Blood Product Expiration Date: 202102282359
Blood Product Expiration Date: 202102282359
ISSUE DATE / TIME: 202102232213
ISSUE DATE / TIME: 202102232304
Unit Type and Rh: 2800
Unit Type and Rh: 8400

## 2019-11-19 LAB — COMPREHENSIVE METABOLIC PANEL
ALT: 110 U/L — ABNORMAL HIGH (ref 0–44)
AST: 407 U/L — ABNORMAL HIGH (ref 15–41)
Albumin: 2.4 g/dL — ABNORMAL LOW (ref 3.5–5.0)
Alkaline Phosphatase: 123 U/L (ref 38–126)
Anion gap: 16 — ABNORMAL HIGH (ref 5–15)
BUN: 44 mg/dL — ABNORMAL HIGH (ref 6–20)
CO2: 25 mmol/L (ref 22–32)
Calcium: 7.1 mg/dL — ABNORMAL LOW (ref 8.9–10.3)
Chloride: 123 mmol/L — ABNORMAL HIGH (ref 98–111)
Creatinine, Ser: 2.06 mg/dL — ABNORMAL HIGH (ref 0.61–1.24)
GFR calc Af Amer: 42 mL/min — ABNORMAL LOW (ref >60)
GFR calc non Af Amer: 36 mL/min — ABNORMAL LOW (ref >60)
Glucose, Bld: 152 mg/dL — ABNORMAL HIGH (ref 70–99)
Potassium: 3.4 mmol/L — ABNORMAL LOW (ref 3.5–5.1)
Sodium: 164 mmol/L (ref 135–145)
Total Bilirubin: 0.7 mg/dL (ref 0.3–1.2)
Total Protein: 5 g/dL — ABNORMAL LOW (ref 6.5–8.1)

## 2019-11-19 LAB — BLOOD GAS, ARTERIAL
Acid-base deficit: 5 mmol/L — ABNORMAL HIGH (ref 0.0–2.0)
Acid-base deficit: 2.7 mmol/L — ABNORMAL HIGH (ref 0.0–2.0)
Bicarbonate: 21.6 mmol/L (ref 20.0–28.0)
Bicarbonate: 24.5 mmol/L (ref 20.0–28.0)
FIO2: 0.6
FIO2: 0.85
MECHVT: 500 mL
MECHVT: 500 mL
Mechanical Rate: 18
Mechanical Rate: 18
O2 Saturation: 85.5 %
O2 Saturation: 89.7 %
PEEP: 5 cmH2O
PEEP: 5 cmH2O
Patient temperature: 37
Patient temperature: 37
pCO2 arterial: 45 mmHg (ref 32.0–48.0)
pCO2 arterial: 51 mmHg — ABNORMAL HIGH (ref 32.0–48.0)
pH, Arterial: 7.29 — ABNORMAL LOW (ref 7.350–7.450)
pH, Arterial: 7.29 — ABNORMAL LOW (ref 7.350–7.450)
pO2, Arterial: 57 mmHg — ABNORMAL LOW (ref 83.0–108.0)
pO2, Arterial: 65 mmHg — ABNORMAL LOW (ref 83.0–108.0)

## 2019-11-19 LAB — RESPIRATORY PANEL BY RT PCR (FLU A&B, COVID)
Influenza A by PCR: NEGATIVE
Influenza B by PCR: NEGATIVE
SARS Coronavirus 2 by RT PCR: NEGATIVE

## 2019-11-19 LAB — PREPARE FRESH FROZEN PLASMA: Unit division: 0

## 2019-11-19 LAB — RESPIRATORY PANEL BY PCR

## 2019-11-19 LAB — PROTIME-INR
INR: 3.4 — ABNORMAL HIGH (ref 0.8–1.2)
INR: 2.2 — ABNORMAL HIGH (ref 0.8–1.2)
INR: 1.8 — ABNORMAL HIGH (ref 0.8–1.2)
Prothrombin Time: 34 seconds — ABNORMAL HIGH (ref 11.4–15.2)
Prothrombin Time: 24.1 seconds — ABNORMAL HIGH (ref 11.4–15.2)
Prothrombin Time: 21.1 seconds — ABNORMAL HIGH (ref 11.4–15.2)

## 2019-11-19 LAB — GLUCOSE, CAPILLARY
Glucose-Capillary: 136 mg/dL — ABNORMAL HIGH (ref 70–99)
Glucose-Capillary: 181 mg/dL — ABNORMAL HIGH (ref 70–99)
Glucose-Capillary: 100 mg/dL — ABNORMAL HIGH (ref 70–99)
Glucose-Capillary: 114 mg/dL — ABNORMAL HIGH (ref 70–99)

## 2019-11-19 LAB — HEMOGLOBIN A1C
Hgb A1c MFr Bld: 5.7 % — ABNORMAL HIGH (ref 4.8–5.6)
Mean Plasma Glucose: 116.89 mg/dL

## 2019-11-19 LAB — URINE DRUG SCREEN, QUALITATIVE (ARMC ONLY)
Amphetamines, Ur Screen: NOT DETECTED
Barbiturates, Ur Screen: NOT DETECTED
Benzodiazepine, Ur Scrn: NOT DETECTED
Cannabinoid 50 Ng, Ur ~~LOC~~: NOT DETECTED
Cocaine Metabolite,Ur ~~LOC~~: NOT DETECTED
MDMA (Ecstasy)Ur Screen: NOT DETECTED
Methadone Scn, Ur: NOT DETECTED
Opiate, Ur Screen: NOT DETECTED
Phencyclidine (PCP) Ur S: NOT DETECTED
Tricyclic, Ur Screen: NOT DETECTED

## 2019-11-19 LAB — BASIC METABOLIC PANEL
Anion gap: 19 — ABNORMAL HIGH (ref 5–15)
BUN: 46 mg/dL — ABNORMAL HIGH (ref 6–20)
CO2: 21 mmol/L — ABNORMAL LOW (ref 22–32)
Calcium: 7.7 mg/dL — ABNORMAL LOW (ref 8.9–10.3)
Chloride: 124 mmol/L — ABNORMAL HIGH (ref 98–111)
Creatinine, Ser: 1.9 mg/dL — ABNORMAL HIGH (ref 0.61–1.24)
GFR calc Af Amer: 46 mL/min — ABNORMAL LOW (ref >60)
GFR calc non Af Amer: 40 mL/min — ABNORMAL LOW (ref >60)
Glucose, Bld: 206 mg/dL — ABNORMAL HIGH (ref 70–99)
Potassium: 3.8 mmol/L (ref 3.5–5.1)
Sodium: 164 mmol/L (ref 135–145)

## 2019-11-19 LAB — TROPONIN I (HIGH SENSITIVITY)
Troponin I (High Sensitivity): 104 ng/L (ref <18)
Troponin I (High Sensitivity): 98 ng/L — ABNORMAL HIGH (ref <18)

## 2019-11-19 LAB — APTT: aPTT: 72 seconds — ABNORMAL HIGH (ref 24–36)

## 2019-11-19 LAB — HIV-1/2 AB - DIFFERENTIATION

## 2019-11-19 LAB — PHOSPHORUS: Phosphorus: 7.4 mg/dL — ABNORMAL HIGH (ref 2.5–4.6)

## 2019-11-19 LAB — HIV ANTIBODY (ROUTINE TESTING W REFLEX): HIV Screen 4th Generation wRfx: REACTIVE — AB

## 2019-11-19 LAB — MAGNESIUM: Magnesium: 2.4 mg/dL (ref 1.7–2.4)

## 2019-11-19 MED ORDER — SODIUM CHLORIDE 0.9% FLUSH
10.0000 mL | Freq: Two times a day (BID) | INTRAVENOUS | Status: DC
  Administered 2019-11-19: 10 mL

## 2019-11-19 MED ORDER — VASOPRESSIN 20 UNIT/ML IV SOLN
0.0300 [IU]/min | INTRAVENOUS | Status: DC
  Administered 2019-11-19: 0.03 [IU]/min via INTRAVENOUS
  Filled 2019-11-19: qty 2

## 2019-11-19 MED ORDER — MORPHINE SULFATE (PF) 4 MG/ML IV SOLN
INTRAVENOUS | Status: AC
  Filled 2019-11-19: qty 1

## 2019-11-19 MED ORDER — SODIUM CHLORIDE 0.9% IV SOLUTION: Freq: Once | INTRAVENOUS | Status: DC

## 2019-11-19 MED ORDER — SODIUM BICARBONATE-DEXTROSE 150-5 MEQ/L-% IV SOLN
150.0000 meq | INTRAVENOUS | Status: DC
  Administered 2019-11-19: 150 meq via INTRAVENOUS
  Filled 2019-11-19: qty 1000

## 2019-11-19 MED ORDER — POTASSIUM CL IN DEXTROSE 5% 20 MEQ/L IV SOLN
20.0000 meq | INTRAVENOUS | Status: DC
  Administered 2019-11-19: 20 meq via INTRAVENOUS
  Filled 2019-11-19 (×2): qty 1000

## 2019-11-19 MED ORDER — SODIUM CHLORIDE 0.9 % IV SOLN
3.0000 g | Freq: Four times a day (QID) | INTRAVENOUS | Status: DC
  Administered 2019-11-19: 3 g via INTRAVENOUS
  Filled 2019-11-19: qty 8
  Filled 2019-11-19: qty 3
  Filled 2019-11-19 (×4): qty 8

## 2019-11-19 MED ORDER — KCL IN DEXTROSE-NACL 20-5-0.9 MEQ/L-%-% IV SOLN
INTRAVENOUS | Status: DC
  Filled 2019-11-19: qty 1000

## 2019-11-19 MED ORDER — CHLORHEXIDINE GLUCONATE CLOTH 2 % EX PADS
6.0000 | MEDICATED_PAD | Freq: Every day | CUTANEOUS | Status: DC
  Administered 2019-11-19: 6 via TOPICAL

## 2019-11-19 MED ORDER — SODIUM CHLORIDE 0.9% FLUSH: 10.0000 mL | INTRAVENOUS | Status: DC | PRN

## 2019-11-19 MED ORDER — DEXTROSE 5 % IV SOLN: INTRAVENOUS | Status: DC

## 2019-11-19 MED ORDER — PANTOPRAZOLE SODIUM 40 MG IV SOLR: 40.0000 mg | Freq: Two times a day (BID) | INTRAVENOUS | Status: DC

## 2019-11-19 MED FILL — Medication: Qty: 1 | Status: AC

## 2019-11-20 LAB — BPAM RBC
Blood Product Expiration Date: 202103172359
Blood Product Expiration Date: 202103212359
Blood Product Expiration Date: 202103212359
Blood Product Expiration Date: 202103222359
Blood Product Expiration Date: 202103222359
Blood Product Expiration Date: 202103272359
Blood Product Expiration Date: 202103272359
ISSUE DATE / TIME: 202102231836
ISSUE DATE / TIME: 202102231836
ISSUE DATE / TIME: 202102232213
ISSUE DATE / TIME: 202102232213
ISSUE DATE / TIME: 202102232305
Unit Type and Rh: 5100
Unit Type and Rh: 5100
Unit Type and Rh: 5100
Unit Type and Rh: 5100
Unit Type and Rh: 6200
Unit Type and Rh: 6200
Unit Type and Rh: 6200

## 2019-11-20 LAB — TYPE AND SCREEN
ABO/RH(D): A POS
Antibody Screen: NEGATIVE
Unit division: 0
Unit division: 0
Unit division: 0
Unit division: 0
Unit division: 0
Unit division: 0
Unit division: 0

## 2019-11-20 LAB — BPAM PLATELET PHERESIS
Blood Product Expiration Date: 202102252359
ISSUE DATE / TIME: 202102240302
Unit Type and Rh: 600

## 2019-11-20 LAB — BLOOD CULTURE ID PANEL (REFLEXED)
Acinetobacter baumannii: NOT DETECTED
Candida albicans: DETECTED — AB
Candida glabrata: NOT DETECTED
Candida krusei: NOT DETECTED
Candida parapsilosis: NOT DETECTED
Candida tropicalis: NOT DETECTED
Enterobacter cloacae complex: NOT DETECTED
Enterobacteriaceae species: NOT DETECTED
Enterococcus species: NOT DETECTED
Escherichia coli: NOT DETECTED
Haemophilus influenzae: NOT DETECTED
Klebsiella oxytoca: NOT DETECTED
Klebsiella pneumoniae: NOT DETECTED
Listeria monocytogenes: NOT DETECTED
Neisseria meningitidis: NOT DETECTED
Proteus species: NOT DETECTED
Pseudomonas aeruginosa: NOT DETECTED
Serratia marcescens: NOT DETECTED
Staphylococcus aureus (BCID): NOT DETECTED
Staphylococcus species: NOT DETECTED
Streptococcus agalactiae: NOT DETECTED
Streptococcus pneumoniae: NOT DETECTED
Streptococcus pyogenes: NOT DETECTED
Streptococcus species: NOT DETECTED

## 2019-11-20 LAB — PREPARE PLATELET PHERESIS: Unit division: 0

## 2019-11-20 LAB — PREPARE FRESH FROZEN PLASMA
Unit division: 0
Unit division: 0

## 2019-11-20 LAB — PREPARE RBC (CROSSMATCH)

## 2019-11-20 LAB — BPAM FFP
Blood Product Expiration Date: 202103012359
Blood Product Expiration Date: 202103012359
ISSUE DATE / TIME: 202102240558
Unit Type and Rh: 6200
Unit Type and Rh: 6200

## 2019-11-22 LAB — CULTURE, BLOOD (ROUTINE X 2)

## 2019-11-24 LAB — CULTURE, BLOOD (ROUTINE X 2)
Culture: NO GROWTH
Special Requests: ADEQUATE

## 2019-11-24 NOTE — Progress Notes (Signed)
1953 per family request, RT extubated, NP, Spanish interpreter, and family @ bedside.  2005 per order 2 RN pronounced pt deceased @ this time with family present. The following items : Red Rosery, wooden cross, pictures of virgin Corrie Dandy & Jesus was left on the pt. ; per family request. These items will be transferred with pt to the morgue.  2118 CDS notified. Post- mortem will be completed.

## 2019-11-24 NOTE — Progress Notes (Signed)
Pt extubated to room air per NP order. Family, RN and NP at bedside.

## 2019-11-24 NOTE — Procedures (Signed)
eeg delayed due to waiting for interpreter to talk to family about support withdrawal

## 2019-11-24 NOTE — Consult Note (Addendum)
Requesting Physician: Dr. Belia Heman    Chief Complaint: Unresponsive  History obtained from:Chart     HPI:                                                                                                                                       Ka Flammer is a 54 y.o. 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  Found unresponsive by family at home. EMS noted patient is aystole and required multiple rounds of CPR.  Initial ABG revealed significant acidosis and active bleeding per ETT and orally, therefore massive blood transfusion protocol initiated and pt received 2 units of pRBC's.  Pt also significantly hypotensive requiring levophed and sodium bicarb gtts.  He was subsequently admitted to ICU for additional workup and treatment. CT head shows generalized loss of the gray-white matter differentiation and diffuse bilateral sulcal effacement, suspect for global hypoxic ischemic injury. Also redomestrated to craniectomy with 4 mm midline to the right. Patient remains off sedation and has fixed non reactive pupils and neurology consulted for brain death examination.    PMH: As above  No family history on file. Social History:  has no history on file for tobacco, alcohol, and drug.  Allergies: No Known Allergies  Medications:                                                                                                                        I reviewed home medications   ROS:  Unable to obtain due to mental status   Examination:                                                                                                       General: Appears well-developed and well-nourished.  Psych: UTO Eyes: No scleral injection HENT: scar on scalp at craniectomy site  Cardiovascular: Normal rate and regular rhythm.  Respiratory: Effort normal and breath sounds normal to anterior ascultation GI: Soft.  No distension. There is no tenderness.  Skin: WDI    Neurological Examination Mental Status: Patient does not respond to verbal stimuli.  Does not respond to deep sternal rub.  Does not follow commands.  No verbalizations noted.  Cranial Nerves: II: patient does not respond confrontation bilaterally, pupils not reactive, right 71mm, left 44mm III,IV,VI: doll's response absent bilaterally. Cold calorics: . V,VII: corneal reflex: absent VIII: patient does not respond to verbal stimuli IX,X: gag reflex absent XI: trapezius strength unable to test bilaterally XII: tongue strength unable to test Motor: Extremities flaccid throughout.  No spontaneous movement noted.  No purposeful movements noted.  Sensory: Does not respond to noxious stimuli in any extremity. Deep Tendon Reflexes:  Absent throughout. Plantars: mute Cerebellar: Unable to perform     Lab Results: Basic Metabolic Panel: Recent Labs  Lab 26-Nov-2019 2119 11/08/2019 0105 11/16/2019 0118 11/11/2019 0422  NA 164*  --  164* 164*  K 5.4*  --  3.8 3.4*  CL 126*  --  124* 123*  CO2 19*  --  21* 25  GLUCOSE 319*  --  206* 152*  BUN 44*  --  46* 44*  CREATININE 2.00*  --  1.90* 2.06*  CALCIUM 7.8*  --  7.7* 7.1*  MG  --  2.4  --   --   PHOS  --  7.4*  --   --     CBC: Recent Labs  Lab November 26, 2019 2119 10/29/2019 0118 10/30/2019 0422 11/04/2019 1052  WBC 4.3 1.4* 1.6* 2.5*  NEUTROABS 1.7 0.6* 0.7* 1.3*  HGB 5.2* 10.9* 10.3* 7.4*  HCT 16.5* 33.0* 31.4* 21.5*  MCV 101.2* 93.0 92.6 90.3  PLT 96* 63* 133* 76*    Coagulation Studies: Recent Labs    10/28/2019 0105 10/31/2019 0422 10/28/2019 1052  LABPROT 34.0* 24.1* 21.1*  INR 3.4* 2.2* 1.8*     Imaging: DG Chest 1 View  Result Date: 2019/11/26 CLINICAL DATA:  ETT placement EXAM: CHEST  1 VIEW COMPARISON:  Radiograph same day 5:30 p.m. FINDINGS: The heart size and mediastinal contours are this is unchanged. ETT is seen 3 cm above the level of the carina. The NG tube is seen with the tip in the proximal stomach. Again noted are multifocal patchy airspace opacities throughout the right lung and left lung base. IMPRESSION: ETT in satisfactory position 3 cm above the carina. NG tube within the proximal stomach. Unchanged bilateral multifocal airspace opacities. Electronically Signed   By: Jonna Clark M.D.   On: Nov 26, 2019 18:39   DG Chest 1 View  Result Date: 2019/11/26 CLINICAL DATA:  Intubation EXAM: CHEST  1 VIEW COMPARISON:  None. FINDINGS: Endotracheal tube in good position. Bilateral airspace disease consistent with pneumonia. No effusion. No pneumothorax. IMPRESSION: Endotracheal tube in good position. Diffuse bilateral pneumonia. Electronically Signed   By: Marlan Palau M.D.   On: 11/14/2019 18:02   DG Abdomen 1 View  Result Date: 11/08/2019 CLINICAL DATA:  NG tube placement EXAM: ABDOMEN - 1 VIEW COMPARISON:  None. FINDINGS: The tip the NG tube is seen within the proximal stomach. There is mildly prominent air-filled loops of small bowel seen within the mid abdomen. IMPRESSION: Tip the NG tube within the proximal stomach Electronically Signed   By: Jonna Clark M.D.   On: 11/17/2019 18:38   CT Head Wo Contrast  Result Date: 11/03/2019 CLINICAL DATA:  Cardiac arrest EXAM: CT HEAD WITHOUT CONTRAST TECHNIQUE: Contiguous axial images were obtained from the base of the skull through the vertex without intravenous contrast. COMPARISON:  None. FINDINGS: Brain: No acute intracranial hemorrhage is visualized. Patient is status post right frontal, parietal and temporal craniectomy. There is mild dural thickening. Hypodensity within the right temporal lobe, inferior frontal lobes and right  frontal and parietal lobes underlying the craniectomy, likely due to evolving posttraumatic changes. Hypodensities within the left greater than right basal ganglia are suggestive of chronic lacunar infarcts. Generalized sulcal edema with diffuse loss of the gray-white matter differentiation. Bulging dura and brain contents at the craniotomy site on the right. About 4 mm midline shift to the right. Vascular: No hyperdense vessels.  No unexpected calcification Skull: Status post right craniectomy. Left frontal small burr holes. Comminuted left frontal bone fracture extending to the left orbital roof. Sinuses/Orbits: Fluid levels in the sphenoid sinus and maxillary sinuses. Mucosal thickening in the ethmoid sinuses. Incompletely visualized facial bone fractures. There is fracture through the anterior ethmoid air cells. Right orbital roof fracture, extending to the superolateral orbital rim. Other: None IMPRESSION: 1. The patient is status post right craniectomy. There are some findings felt related to evolving posttraumatic changes at the bilateral frontal, right parietal and right temporal lobes. However there is generalized loss of the gray-white matter differentiation in addition to diffuse bilateral sulcal effacement, suspect for acute diffuse brain swelling and probable global hypoxic ischemic injury. About 4 mm midline shift to the right with bulging of dura and brain contents through the right craniectomy site. No hemorrhage is visualized. 2. Incompletely visualized fractures involving the left frontal bone, bilateral orbital roofs, and anterior ethmoid sinuses. Electronically Signed   By: Jasmine Pang M.D.   On: 11/01/2019 20:47   DG Chest Port 1 View  Result Date: December 13, 2019 CLINICAL DATA:  Assess for interval change EXAM: PORTABLE CHEST 1 VIEW COMPARISON:  Radiograph 11/09/2019 FINDINGS: Endotracheal tube terminates in the mid trachea, 4 cm from the carina. Transesophageal tube tip and side port distal  to the GE junction. Telemetry leads and pacer pads overlie the chest wall. Significant interval progression of the perihilar predominant opacities throughout both lungs with fissural and septal thickening. The pulmonary vascularity is indistinct. Cardiac silhouette is stable from prior accounting for differences in technique. No pneumothorax. No visible effusion. IMPRESSION: Significant interval worsening of dense perihilar opacities, worrisome for developing alveolar pulmonary edema Electronically Signed   By: Kreg Shropshire M.D.   On: 12-13-19 05:05     I have reviewed the above imaging:CT head    ASSESSMENT AND PLAN  54 y.o. male 54 yo male with h/oHIV (dx 09/2011), Esophageal Candidiasis, Polysubstance Abuse ("crack cocaine"), GERD,HIV,Pneumonia questionable PCP, Severe Calorie  Malnutrition,TBIafter falling off a ladder s/p craniectomy admitted to [facility] on 06-Dec-2019 s/p PEA arrest.  Patient exam off sedation, has no brainstem reflexes.  Pupils are fixed and dilated.  Exam is concerning for severe brain injury and possible brain death.  CT head also concerning for diffuse cerebral edema.  Anoxic Bran injury  Suspected Brain death  Temperature below 36 C, sodium above 160. -Formal brain death testing after patient optimized. Will need apnea testing to confirm brain death.   Addendum -Was informed that after discussion with CCM, family decided to withdraw care and requested comfort care measures  Neurology will be available as needed  CRITICAL CARE Performed by: Lanice Schwab Hiroto Saltzman   Total critical care time: 35 minutes  Critical care time was exclusive of separately billable procedures and treating other patients.  Critical care was necessary to treat or prevent imminent or life-threatening deterioration.  Critical care was time spent personally by me on the following activities: development of treatment plan with patient and/or surrogate as well as nursing, discussions with  consultants, evaluation of patient's response to treatment, examination of patient, obtaining history from patient or surrogate, ordering and performing treatments and interventions, ordering and review of laboratory studies, ordering and review of radiographic studies, pulse oximetry and re-evaluation of patient's condition.  Clinton Wahlberg [facility] Pager Number [phone]

## 2019-11-24 NOTE — Progress Notes (Signed)
Performed one way extubation with multiple family members at bedside as well as Spanish interpretor.  Pts family confirmed/agreed with one way extubation, pt DNR/DNI.  Will continue to monitor and assess pt

## 2019-11-24 NOTE — Progress Notes (Signed)
CRITICAL CARE NOTE  CC  follow up respiratory failure/cardiac arrest  SUBJECTIVE Patient remains critically ill Prognosis is guarded Multiorgan failure Multiple vasopressors Severe hypoxia  Signs of severe brain damage and brain death  Family At bedside, clinical status relayed to family in person(wife and son) and also with family over the phone  Updated and notified of patients medical condition-  Progressive multiorgan failure with NO chance of meaningful recovery. Patient showing signs of severe brain damage with multiple organs that have failed   Patient is in dying Process and showing signs of brain death  The whole Family understands the situation.  They have consented and agreed to DNR. Will proceed with Comfort care measures when family arrives.    Family are satisfied with Plan of action and management. All questions answered    BP 92/72   Pulse (!) 124   Temp (!) 96.1 F (35.6 C)   Resp (!) 22   Ht 5\' 8"  (1.727 m)   Wt 55.5 kg   SpO2 98%   BMI 18.60 kg/m    I/O last 3 completed shifts: In: 7073.6 [I.V.:2540; Blood:1442.1; IV Piggyback:3091.5] Out: 250 [Urine:250] Total I/O In: 366.7 [Blood:366.7] Out: -   SpO2: 98 % O2 Flow Rate (L/min): 40 L/min FiO2 (%): 100 %   SIGNIFICANT EVENTS 2/23 sudden cardiac arrest 2022-12-03 signs of brain death/severe brain damage  REVIEW OF SYSTEMS  PATIENT IS UNABLE TO PROVIDE COMPLETE REVIEW OF SYSTEMS DUE TO SEVERE CRITICAL ILLNESS   PHYSICAL EXAMINATION:  GENERAL:critically ill appearing, +resp distress HEAD: Normocephalic, atraumatic.  EYES: pupils fixed, dilated, no gag, no corneal relfex  MOUTH: Moist mucosal membrane. NECK: Supple.  PULMONARY: +rhonchi, +wheezing CARDIOVASCULAR: S1 and S2. Regular rate and rhythm. No murmurs, rubs, or gallops.  GASTROINTESTINAL: Soft, nontender, -distended.  Positive bowel sounds.   MUSCULOSKELETAL: No swelling, clubbing, or edema.  NEUROLOGIC: obtunded,  GCS<3T SKIN:intact,warm,dry  MEDICATIONS: I have reviewed all medications and confirmed regimen as documented   CULTURE RESULTS   Recent Results (from the past 240 hour(s))  Respiratory Panel by PCR     Status: None   Collection Time: 12-03-2019  6:30 PM   Specimen: Nasopharyngeal Swab; Respiratory  Result Value Ref Range Status   Adenovirus NOT DETECTED NOT DETECTED Final   Coronavirus 229E NOT DETECTED NOT DETECTED Final    Comment: (NOTE) The Coronavirus on the Respiratory Panel, DOES NOT test for the novel  Coronavirus (2019 nCoV)    Coronavirus HKU1 NOT DETECTED NOT DETECTED Final   Coronavirus NL63 NOT DETECTED NOT DETECTED Final   Coronavirus OC43 NOT DETECTED NOT DETECTED Final   Metapneumovirus NOT DETECTED NOT DETECTED Final   Rhinovirus / Enterovirus NOT DETECTED NOT DETECTED Final   Influenza A NOT DETECTED NOT DETECTED Final   Influenza B NOT DETECTED NOT DETECTED Final   Parainfluenza Virus 1 NOT DETECTED NOT DETECTED Final   Parainfluenza Virus 2 NOT DETECTED NOT DETECTED Final   Parainfluenza Virus 3 NOT DETECTED NOT DETECTED Final   Parainfluenza Virus 4 NOT DETECTED NOT DETECTED Final   Respiratory Syncytial Virus NOT DETECTED NOT DETECTED Final   Bordetella pertussis NOT DETECTED NOT DETECTED Final   Chlamydophila pneumoniae NOT DETECTED NOT DETECTED Final   Mycoplasma pneumoniae NOT DETECTED NOT DETECTED Final    Comment: Performed at [facility], [address], [place], [place] [address]  MRSA PCR Screening     Status: None   Collection Time: Dec 03, 2019  9:01 PM   Specimen: Nasopharyngeal  Result Value Ref Range Status   MRSA by PCR NEGATIVE NEGATIVE Final    Comment:        The GeneXpert MRSA Assay (FDA approved for NASAL specimens only), is one component of a comprehensive MRSA colonization surveillance program. It is not intended to diagnose MRSA infection nor to guide or monitor treatment for MRSA infections. Performed at [facility], [address] [place], [place] [address]   CULTURE, BLOOD (ROUTINE X 2) w Reflex to ID Panel     Status: None (Preliminary result)   Collection Time: 12/13/2019  1:05 AM   Specimen: BLOOD  Result Value Ref Range Status   Specimen Description BLOOD RIGHT HAND  Final   Special Requests   Final    BOTTLES DRAWN AEROBIC ONLY Blood Culture adequate volume   Culture   Final    NO GROWTH < 12 HOURS Performed at [facility], [address] [place], [place] [address]    Report Status PENDING  Incomplete  CULTURE, BLOOD (ROUTINE X 2) w Reflex to ID Panel     Status: None (Preliminary result)   Collection Time: December 13, 2019  1:15 AM   Specimen: BLOOD  Result Value Ref Range Status   Specimen Description BLOOD RIGHT WRIST  Final   Special Requests   Final    BOTTLES DRAWN AEROBIC ONLY Blood Culture results may not be optimal due to an inadequate volume of blood received in culture bottles   Culture   Final    NO GROWTH < 12 HOURS Performed at [facility], [address], [place], [place] [address]    Report Status PENDING  Incomplete  Respiratory Panel by RT PCR (Flu A&B, Covid) - Nasopharyngeal Swab     Status: None   Collection Time: December 13, 2019  5:51 AM   Specimen: Nasopharyngeal Swab  Result Value Ref Range Status   SARS Coronavirus 2 by RT PCR NEGATIVE NEGATIVE Final    Comment: (NOTE) SARS-CoV-2 target nucleic acids are NOT DETECTED. The SARS-CoV-2 RNA is generally detectable in upper respiratoy specimens during the acute phase of infection. The lowest concentration of SARS-CoV-2 viral copies this assay can detect is 131 copies/mL. A negative result does not preclude SARS-Cov-2 infection and should not be used as the sole basis for treatment or other patient management decisions. A negative result may occur with  improper specimen collection/handling, submission of specimen other than nasopharyngeal swab, presence of viral mutation(s) within the areas  targeted by this assay, and inadequate number of viral copies (<131 copies/mL). A negative result must be combined with clinical observations, patient history, and epidemiological information. The expected result is Negative. Fact Sheet for Patients:  https://www.moore.com/ Fact Sheet for Healthcare Providers:  https://www.young.biz/ This test is not yet ap proved or cleared by the [place] FDA and  has been authorized for detection and/or diagnosis of SARS-CoV-2 by FDA under an Emergency Use Authorization (EUA). This EUA will remain  in effect (meaning this test can be used) for the duration of the COVID-19 declaration under Section 564(b)(1) of the Act, 21 U.S.C. section 360bbb-3(b)(1), unless the authorization is terminated or revoked sooner.    Influenza A by PCR NEGATIVE NEGATIVE Final   Influenza B by PCR NEGATIVE NEGATIVE Final    Comment: (NOTE) The Xpert Xpress SARS-CoV-2/FLU/RSV assay is intended as an aid in  the diagnosis of influenza from Nasopharyngeal swab specimens and  should not be used as a sole basis for treatment. Nasal washings and  aspirates  are unacceptable for Xpert Xpress SARS-CoV-2/FLU/RSV  testing. Fact Sheet for Patients: https://www.moore.com/ Fact Sheet for Healthcare Providers: https://www.young.biz/ This test is not yet approved or cleared by the [place] FDA and  has been authorized for detection and/or diagnosis of SARS-CoV-2 by  FDA under an Emergency Use Authorization (EUA). This EUA will remain  in effect (meaning this test can be used) for the duration of the  Covid-19 declaration under Section 564(b)(1) of the Act, 21  U.S.C. section 360bbb-3(b)(1), unless the authorization is  terminated or revoked. Performed at [facility], [address] [place], [place] [address]           IMAGING    DG Chest 1 View  Result Date:  2019-12-08 CLINICAL DATA:  ETT placement EXAM: CHEST  1 VIEW COMPARISON:  Radiograph same day 5:30 p.m. FINDINGS: The heart size and mediastinal contours are this is unchanged. ETT is seen 3 cm above the level of the carina. The NG tube is seen with the tip in the proximal stomach. Again noted are multifocal patchy airspace opacities throughout the right lung and left lung base. IMPRESSION: ETT in satisfactory position 3 cm above the carina. NG tube within the proximal stomach. Unchanged bilateral multifocal airspace opacities. Electronically Signed   By: Jonna Clark M.D.   On: 12/08/19 18:39   DG Chest 1 View  Result Date: 08-Dec-2019 CLINICAL DATA:  Intubation EXAM: CHEST  1 VIEW COMPARISON:  None. FINDINGS: Endotracheal tube in good position. Bilateral airspace disease consistent with pneumonia. No effusion. No pneumothorax. IMPRESSION: Endotracheal tube in good position. Diffuse bilateral pneumonia. Electronically Signed   By: Marlan Palau M.D.   On: Dec 08, 2019 18:02   DG Abdomen 1 View  Result Date: 2019/12/08 CLINICAL DATA:  NG tube placement EXAM: ABDOMEN - 1 VIEW COMPARISON:  None. FINDINGS: The tip the NG tube is seen within the proximal stomach. There is mildly prominent air-filled loops of small bowel seen within the mid abdomen. IMPRESSION: Tip the NG tube within the proximal stomach Electronically Signed   By: Jonna Clark M.D.   On: 12-08-19 18:38   CT Head Wo Contrast  Result Date: 2019-12-08 CLINICAL DATA:  Cardiac arrest EXAM: CT HEAD WITHOUT CONTRAST TECHNIQUE: Contiguous axial images were obtained from the base of the skull through the vertex without intravenous contrast. COMPARISON:  None. FINDINGS: Brain: No acute intracranial hemorrhage is visualized. Patient is status post right frontal, parietal and temporal craniectomy. There is mild dural thickening. Hypodensity within the right temporal lobe, inferior frontal lobes and right frontal and parietal lobes underlying the  craniectomy, likely due to evolving posttraumatic changes. Hypodensities within the left greater than right basal ganglia are suggestive of chronic lacunar infarcts. Generalized sulcal edema with diffuse loss of the gray-white matter differentiation. Bulging dura and brain contents at the craniotomy site on the right. About 4 mm midline shift to the right. Vascular: No hyperdense vessels.  No unexpected calcification Skull: Status post right craniectomy. Left frontal small burr holes. Comminuted left frontal bone fracture extending to the left orbital roof. Sinuses/Orbits: Fluid levels in the sphenoid sinus and maxillary sinuses. Mucosal thickening in the ethmoid sinuses. Incompletely visualized facial bone fractures. There is fracture through the anterior ethmoid air cells. Right orbital roof fracture, extending to the superolateral orbital rim. Other: None IMPRESSION: 1. The patient is status post right craniectomy. There are some findings felt related to evolving posttraumatic changes at the bilateral frontal, right parietal and right temporal lobes. However there is  generalized loss of the gray-white matter differentiation in addition to diffuse bilateral sulcal effacement, suspect for acute diffuse brain swelling and probable global hypoxic ischemic injury. About 4 mm midline shift to the right with bulging of dura and brain contents through the right craniectomy site. No hemorrhage is visualized. 2. Incompletely visualized fractures involving the left frontal bone, bilateral orbital roofs, and anterior ethmoid sinuses. Electronically Signed   By: Jasmine Pang M.D.   On: 11/17/2019 20:47   DG Chest Port 1 View  Result Date: 11/28/19 CLINICAL DATA:  Assess for interval change EXAM: PORTABLE CHEST 1 VIEW COMPARISON:  Radiograph 11/13/2019 FINDINGS: Endotracheal tube terminates in the mid trachea, 4 cm from the carina. Transesophageal tube tip and side port distal to the GE junction. Telemetry leads and  pacer pads overlie the chest wall. Significant interval progression of the perihilar predominant opacities throughout both lungs with fissural and septal thickening. The pulmonary vascularity is indistinct. Cardiac silhouette is stable from prior accounting for differences in technique. No pneumothorax. No visible effusion. IMPRESSION: Significant interval worsening of dense perihilar opacities, worrisome for developing alveolar pulmonary edema Electronically Signed   By: Kreg Shropshire M.D.   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   Overall, patient is critically ill, prognosis is guarded.  Patient with Multiorgan failure and at high risk for cardiac arrest and death.   PATIENT IS DNR PLAN FOR CMO WHEN FAMILY ARRIVES   Brevon Dewald Santiago Glad, M.D.  [facility]  Medical Director [facility] Medical Director [facility] Cardio-Pulmonary Department

## 2019-11-24 NOTE — Consult Note (Signed)
Consultation Note Date: 12-04-19   Patient Name: Brent Levy  DOB: [birth year]  MRN: [id number]  Age / Sex: 54 y.o., male  PCP: Patient, No Pcp Per Referring Physician: Flora Lipps, MD  Reason for Consultation: Establishing goals of care  HPI/Patient Profile: 54 y.o. 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 Patient required multiple rounds of CPR - total CPR greater than one hour. Patient now requiring significant vasopressor support. Per neurosurgery, patient likely meets criteria for brain death. PMT consulted to assist with goals of care.   Clinical Assessment and Goals of Care: I have reviewed medical records including EPIC notes, labs and imaging, received report from RN, assessed the patient and then spoke with family to discuss diagnosis prognosis, GOC, EOL wishes, disposition and options.  I used interpreter services to speak with family via telephone - it is unclear who all was listening to conversation as multiple different voices were present - confirmed that patient's son Brent Levy was present and wife Brent.   I introduced Palliative Medicine as specialized medical care for people living with serious illness. It focuses on providing relief from the symptoms and stress of a serious illness. The goal is to improve quality of life for both the patient and the family.   We discussed his current illness and what it means in the larger context of his on-going co-morbidities.  Natural disease trajectory and expectations at EOL were discussed. I explicitly explained patient's condition - told them the severity of his illness and that patient was dying. Also told them about  significant injury to brain - no signs of brain function.  Family was shocked with news of how ill patient was - very upset during discussion. Wife became unable to further participate and discussion was continued with son, Brent Levy. I encouraged family to come to see patient as he appears to be nearing end of life. I recommended DNR status as additional CPR will not benefit patient. Family tells me "do everything to keep him alive". I explained all the measures that were being taken to keep Brent Levy alive but despite these measures he is dying. Family tells me they will come to the hospital later today but do not know when they will come or who will come. I shared that Brent Levy could pass away at anytime.  Family also brings up patient's injury in October and how he survived that - we discussed that this is a different situation and Brent Levy is dying. I am unsure they grasp the severity of the situation and will likely need additional reinforcement.   Questions and concerns were addressed. The family was encouraged to call with questions or concerns.   Primary Decision Maker NEXT OF KIN - spouse Brent Levy   SUMMARY OF RECOMMENDATIONS   - long discussion with multiple family members with interpreter - explained patient is dying multiple times, unsure if they grasp severity of situation - DNR recommended but family not agreeable at this time - Family to come to hospital later today  Dr. Mortimer Levy updated on above conversation  Code Status/Advance Care Planning:  Full code  Prognosis:   Hours - Days  Discharge Planning: Anticipated Hospital Death      Primary Diagnoses: Present on Admission: . Cardiac arrest ([place])  I have reviewed the medical record, interviewed the patient and family, and examined the patient. The following aspects are pertinent.  No past medical history on file. Social History   Socioeconomic History  . Marital status: Married    Spouse name: Not on  file  . Number of children: Not on file  . Years of education: Not on file  . Highest education level: Not on file  Occupational History  . Not on file  Tobacco Use  . Smoking status: Not on file  Substance and Sexual Activity  . Alcohol use: Not on file  . Drug use: Not on file  . Sexual activity: Not on file  Other Topics Concern  . Not on file  Social History Narrative  . Not on file   Social Determinants of Health   Financial Resource Strain:   . Difficulty of Paying Living Expenses: Not on file  Food Insecurity:   . Worried About Programme researcher, broadcasting/film/video in the Last Year: Not on file  . Ran Out of Food in the Last Year: Not on file  Transportation Needs:   . Lack of Transportation (Medical): Not on file  . Lack of Transportation (Non-Medical): Not on file  Physical Activity:   . Days of Exercise per Week: Not on file  . Minutes of Exercise per Session: Not on file  Stress:   . Feeling of Stress : Not on file  Social Connections:   . Frequency of Communication with Friends and Family: Not on file  . Frequency of Social Gatherings with Friends and Family: Not on file  . Attends Religious Services: Not on file  . Active Member of Clubs or Organizations: Not on file  . Attends Banker Meetings: Not on file  . Marital Status: Not on file   No family history on file. Scheduled Meds: . sodium chloride   Intravenous Once  . sodium chloride   Intravenous Once  . sodium chloride   Intravenous Once  . sodium chloride   Intravenous Once  . sodium chloride   Intravenous Once  . sodium chloride   Intravenous Once  . Chlorhexidine Gluconate Cloth  6 each Topical Daily  . dextrose  12.5 g Intravenous STAT  . free water  200 mL Per Tube Q4H  . insulin aspart  0-9 Units Subcutaneous Q4H  . sodium chloride flush  10-40 mL Intracatheter Q12H  . sodium chloride flush  3 mL Intravenous Q12H   Continuous Infusions: . sodium chloride    . sodium chloride    . sodium  chloride    . ampicillin-sulbactam (UNASYN) IV    . norepinephrine (LEVOPHED) Adult infusion 50 mcg/min (10/28/2019 1043)  . pantoprozole (PROTONIX) infusion 8 mg/hr (10/31/2019 0013)  . phenylephrine (NEO-SYNEPHRINE) Adult infusion 200 mcg/min (10/29/2019 0921)  . sodium bicarbonate 150 mEq in dextrose 5% 1000 mL    . vasopressin (PITRESSIN) infusion - *FOR SHOCK* 0.03 Units/min (11/02/2019 1010)   PRN Meds:.sodium chloride, albuterol, ondansetron (ZOFRAN) IV, polyvinyl alcohol, sodium chloride flush, sodium chloride flush No Known Allergies Review of Systems  Unable to perform ROS: Acuity of condition    Physical Exam Constitutional:      Appearance: He is underweight.     Interventions: He is intubated.     Comments: Unresponsive, GCS 3  Cardiovascular:     Rate and Rhythm: Tachycardia present.  Pulmonary:     Effort: He is intubated.     Vital Signs: BP 105/77   Pulse Marland Kitchen)  124   Temp 99.1 F (37.3 C)   Resp (!) 22   Ht 5\' 8"  (1.727 m)   Wt 55.5 kg   SpO2 96%   BMI 18.60 kg/m  Pain Scale: CPOT       SpO2: SpO2: 96 % O2 Device:SpO2: 96 % O2 Flow Rate: .O2 Flow Rate (L/min): 40 L/min  IO: Intake/output summary:   Intake/Output Summary (Last 24 hours) at 12/16/2019 1059 Last data filed at 12-16-2019 0600 Gross per 24 hour  Intake 7073.55 ml  Output 250 ml  Net 6823.55 ml    LBM: Last BM Date: 12-16-19 Baseline Weight: Weight: 63.5 kg Most recent weight: Weight: 55.5 kg     Palliative Assessment/Data: PPS 10%     Time In: 0900 Time Out: 1100 Time Total: 120 minutes Greater than 50%  of this time was spent counseling and coordinating care related to the above assessment and plan.  11/21/19, DNP, AGNP-C Palliative Medicine Team [phone] Pager: [phone]

## 2019-11-24 NOTE — Progress Notes (Addendum)
[place] visited pt. as follow-up from [place] visit earlier last night.  Pt. intubated, sedated, on numerous IV medications; pt.'s son and wife @ bedside.  Medical interpreter and MD in rm. when [facility] arrived; Son had other family members on speakerphone during conversation w/MD.; family tearful and experiencing acute anticipatory grief.  Medical interpreter introduced [facility], and family requested Catholic priest come for last rites; [facility] coordinated visit from Fr. Eddie Dibbles --ritual performed @ bedside w/participation from wife and son; other family participating via video call.  Family plans to gather @ rm. between 7-9pm to be present @ bedside w/pt. and eventually to withdraw life support.  [place] recommends chaplains follow up if possible. 3pm: CH encountered organ donation family counselor @ ICU desk; counselor asked for [facility] and medical interpreter's assistance in speaking w/family re: evaluating pt. for potential organ donation; [place] expressed concern that family was not ready for discussion of this kind, observing both wife and son openly weeping over pt.  Counselor insisted on family visit being conducted now to prevent family members leaving before conversation could be had.  Counselor met family in pt.'s rm., utilizing medical interpreter; [place] not present for conversation, but interpreter shared pt.'s wife refused organ donation for pt.         11-26-2019 1430  Clinical Encounter Type  Visited With Patient and family together;Health care provider (Medical Interpreter; Organ Donation Family Counselor)  Visit Type Follow-up;Spiritual support;Social support;Patient actively dying;Critical Care  Referral From Chaplain  Consult/Referral To Chaplain  Recommendations Follow-up w/family tonight @ 7-9pm  Spiritual Encounters  Spiritual Needs Emotional;Ritual;Prayer;Other (Comment) (Last Rites)  Stress Factors  Family Stress Factors Loss;Loss of control;Major life changes;Health changes

## 2019-11-24 NOTE — Consult Note (Signed)
Referring Physician:  No referring provider defined for this encounter.  Primary Physician:  Patient, No Pcp Per  Chief Complaint:  Code and possible anoxic brain injury  Level 5 consultation - patient critically ill and unable to participate in examination or history.  History of Present Illness: 12-13-19 Brent Levy is a 54 y.o. male who presents with the chief complaint of altered mental status after a witness pulseless event at home.  He has history of TBI status post craniectomy.    He was at home when he began having difficulty breathing, then went into arrest.  CPR was initiated by EMS, who regained pulse.  He then lost pulses both during transport and in the ER at [facility].    He has not moved in ICU, and is currently off all sedating medications.   Review of Systems:  A 10 point review of systems is negative, except for the pertinent positives and negatives detailed in the HPI.  Past Medical History: No past medical history on file.  Past Surgical History: Cannot obtain  Allergies: Allergies as of 11/07/2019  . (No Known Allergies)    Medications:  Current Facility-Administered Medications:  .  0.9 %  sodium chloride infusion (Manually program via Guardrails IV Fluids), , Intravenous, Once, Blakeney, Dreama Saa, NP .  0.9 %  sodium chloride infusion (Manually program via Guardrails IV Fluids), , Intravenous, Once, Blakeney, Dreama Saa, NP .  0.9 %  sodium chloride infusion (Manually program via Guardrails IV Fluids), , Intravenous, Once, Blakeney, Dreama Saa, NP .  0.9 %  sodium chloride infusion (Manually program via Guardrails IV Fluids), , Intravenous, Once, Blakeney, Dreama Saa, NP .  0.9 %  sodium chloride infusion (Manually program via Guardrails IV Fluids), , Intravenous, Once, Blakeney, Dreama Saa, NP .  0.9 %  sodium chloride infusion (Manually program via Guardrails IV Fluids), , Intravenous, Once, Blakeney, Dreama Saa, NP .  0.9 %  sodium chloride infusion, 250 mL,  Intravenous, Continuous, Nance Pear, MD .  0.9 %  sodium chloride infusion, 250 mL, Intravenous, PRN, Mortimer Fries, Kurian, MD .  0.9 %  sodium chloride infusion, 10 mL/hr, Intravenous, Once, Nance Pear, MD .  albuterol (PROVENTIL) (2.5 MG/3ML) 0.083% nebulizer solution 2.5 mg, 2.5 mg, Nebulization, Q4H PRN, Awilda Bill, NP .  Chlorhexidine Gluconate Cloth 2 % PADS 6 each, 6 each, Topical, Daily, Blakeney, Dana G, NP .  dextrose 50 % solution 12.5 g, 12.5 g, Intravenous, STAT, Blakeney, Dana G, NP .  free water 200 mL, 200 mL, Per Tube, Q4H, Awilda Bill, NP, 200 mL at 13-Dec-2019 0735 .  insulin aspart (novoLOG) injection 0-9 Units, 0-9 Units, Subcutaneous, Q4H, Awilda Bill, NP, 1 Units at 12/13/19 0458 .  norepinephrine (LEVOPHED) 16 mg in 245mL premix infusion, 0-50 mcg/min, Intravenous, Titrated, Blakeney, Dreama Saa, NP, Last Rate: 37.5 mL/hr at 2019-12-13 0600, 40 mcg/min at 12/13/2019 0600 .  ondansetron (ZOFRAN) injection 4 mg, 4 mg, Intravenous, Q6H PRN, Mortimer Fries, Kurian, MD .  pantoprazole (PROTONIX) 80 mg in sodium chloride 0.9 % 250 mL (0.32 mg/mL) infusion, 8 mg/hr, Intravenous, Continuous, Blakeney, Dana G, NP, Last Rate: 25 mL/hr at 12-13-19 0013, 8 mg/hr at 2019-12-13 0013 .  phenylephrine CONCENTRATED 100mg  in sodium chloride 0.9% 282mL (0.4mg /mL) infusion, 0-400 mcg/min, Intravenous, Titrated, Blakeney, Dreama Saa, NP, Last Rate: 30 mL/hr at 12-13-19 0921, 200 mcg/min at 2019/12/13 0921 .  piperacillin-tazobactam (ZOSYN) IVPB 3.375 g, 3.375 g, Intravenous, Q8H, Blakeney, Dreama Saa, NP, Last Rate: 12.5  mL/hr at 09-Dec-2019 0525, 3.375 g at 09-Dec-2019 0525 .  polyvinyl alcohol (LIQUIFILM TEARS) 1.4 % ophthalmic solution 2 drop, 2 drop, Both Eyes, Q6H PRN, Blakeney, Dana G, NP .  sodium bicarbonate 150 mEq in sterile water 1,000 mL infusion, , Intravenous, Continuous, Blakeney, Neldon Newport, NP, Last Rate: 70 mL/hr at 12-09-19 0600, Rate Change at Dec 09, 2019 0600 .  sodium chloride flush (NS) 0.9 %  injection 10-40 mL, 10-40 mL, Intracatheter, Q12H, Blakeney, Dana G, NP .  sodium chloride flush (NS) 0.9 % injection 10-40 mL, 10-40 mL, Intracatheter, PRN, Eugenie Norrie, NP .  sodium chloride flush (NS) 0.9 % injection 3 mL, 3 mL, Intravenous, Q12H, Kasa, Kurian, MD .  sodium chloride flush (NS) 0.9 % injection 3 mL, 3 mL, Intravenous, PRN, Belia Heman, Kurian, MD .  vasopressin (PITRESSIN) 100 Units in dextrose 5 % 250 mL (0.4 Units/mL) infusion, 0.2-0.8 Units/min, Intravenous, Continuous, Blakeney, Dana G, NP, Last Rate: 120 mL/hr at 12/09/19 0917, 0.8 Units/min at December 09, 2019 2951   Social History: Social History   Tobacco Use  . Smoking status: Not on file  Substance Use Topics  . Alcohol use: Not on file  . Drug use: Not on file    Family Medical History: No family history on file.  Physical Examination: Vitals:   2019/12/09 0900 12/09/19 0904  BP: 105/77   Pulse:  (!) 124  Resp: (!) 22   Temp:    SpO2:  96%     General: Patient is well developed, well nourished, and comatose  Psychiatric: Patient is comatose  Head:  Pupils 64mm on R, 27mm on left, midposition, unreactive  ENT:  Oral mucosa appears well hydrated.  Neck:   Supple.  Full range of motion.  Respiratory: Patient is ventilated and not overbreathing the ventilator  Extremities: No edema.  Vascular: Palpable pulses in dorsal pedal vessels.  Skin:   On exposed skin, there are no abnormal skin lesions.  NEUROLOGICAL:  General: Comatose.  Patient does not open eyes.  Does not follow commands.  No speech.  No movement of any extremity to central or peripheral stimulation.  Pupils 20mm R, 101mm left and nonreactive, midposition, ovoid.  No corneals or vestibuloocular reflexes.  No cough or gag.  No movement to central or peripheral stimulation.   Imaging: CT Head 11/09/2019 2047 IMPRESSION: 1. The patient is status post right craniectomy. There are some findings felt related to evolving posttraumatic changes  at the bilateral frontal, right parietal and right temporal lobes. However there is generalized loss of the gray-white matter differentiation in addition to diffuse bilateral sulcal effacement, suspect for acute diffuse brain swelling and probable global hypoxic ischemic injury. About 4 mm midline shift to the right with bulging of dura and brain contents through the right craniectomy site. No hemorrhage is visualized. 2. Incompletely visualized fractures involving the left frontal bone, bilateral orbital roofs, and anterior ethmoid sinuses.   Electronically Signed   By: Jasmine Pang M.D.   On: 11/07/2019 20:47 I have personally reviewed the images and agree with the above interpretation.  Labs: CBC Latest Ref Rng & Units Dec 09, 2019 12/09/19 11/06/2019  WBC 4.0 - 10.5 K/uL 1.6(L) 1.4(LL) 4.3  Hemoglobin 13.0 - 17.0 g/dL 10.3(L) 10.9(L) 5.2(L)  Hematocrit 39.0 - 52.0 % 31.4(L) 33.0(L) 16.5(L)  Platelets 150 - 400 K/uL 133(L) 63(L) 96(L)    Assessment and Plan: Brent Levy is a pleasant 54 y.o. 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  He has not had  any improvement despite ICU support.  He is currently GCS 3T without sign of brainstem or cortical function.  The patient has not been formally declared braindead, but likely meets clinical criteria for brain death.  I would not recommend any surgical or procedural intervention.  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  I would recommend discussion with family for withdrawal of care versus formal braindeath declaration.   Khye Hochstetler K. Myer Haff MD, MPHS Dept. of Neurosurgery

## 2019-11-24 NOTE — Death Summary Note (Addendum)
DEATH SUMMARY   Patient Details  Name: Brent Levy MRN: [id number] DOB: 03-03-1966  Admission/Discharge Information   Admit Date:  2019/12/04  Date of Death:  2019/12/05  Time of Death:  01/26/04  Length of Stay: 1  Referring Physician: Patient, No Pcp Per   Reason(s) for Hospitalization  Cardiac Arrest   Diagnoses  Preliminary cause of death: Acute respiratory failure (HCC) ISCHEMIC CARDIOMYOPATHY Secondary Diagnoses (including complications and co-morbidities):  Active Problems:   Cardiac arrest [facility])   Cardiopulmonary arrest (HCC)   Goals of care, counseling/discussion   Palliative care by specialist   Hemorrhagic Shock secondary to gastrointestinal bleeding    Cardiogenic Shock    Acute renal failure with hyperkalemia    Hypernatremia   Severe metabolic acidosis    Aspiration pneumonia   Thrombocytopenia etiology unknown    Aspiration pneumonia    Transiminitis secondary to hypotension    Acute metabolic encephalopathy secondary to metabolic derangements and    diffuse cerebral edema and probable global hypoxic ischemic injury    Traumatic Brain Injury requiring right decompressive craniectomy for evacuation    of subdural hematoma and frontal occlusion    PEG tube    HIV positive   Brief Hospital Course (including significant findings, care, treatment, and services provided and events leading to death)  Brent Levy is a 54 y.o. year old male who presented to [facility] ER on December 04, 2019 following an out of hospital cardiac arrest.  Per ER notes pts family reported the pt initially developed shortness of breath followed by unresponsiveness and pulselessness requiring EMS notification.  Upon EMS arrival pt asystole and pulseless requiring multiple rounds of CPR/ACLS protocol with ROSC prior to transport, and again cardiac arrested again en route to [facility] ER.  Upon arrival to the ER pt unresponsive with ETT in place.  Pt lost his pulse again requiring multiple  rounds of CPR prior to ROSC (see code sheet for details).  Initial ABG revealed significant acidosis and active bleeding per ETT and orally, therefore massive blood transfusion protocol initiated and pt received 2 units of pRBC's.  Pt also significantly hypotensive requiring multiple pressors and sodium bicarb gtts.  He was subsequently admitted to ICU with cardiogenic/hemorrhagic shock, acute renal failure with severe metabolic acidosis, acute hypoxic hypercapnic respiratory failure and remained mechanically intubated. Pt deemed NOT a hypothermic protocol candidate due to prolonged downtime total CPR greater than 1 hour.  Despite receiving multiple blood products and maximal doses of vasopressin, levophed, and neo-synephrine gtts he remained hypotensive.  On 12-04-19 CT Head revealed diffuse cerebral edema with 4 mm midline shift to the right with bulging dura and brain contents through the right craniectomy site along with probable global hypoxic ischemic injury.  Pt remained unresponsive with no gag or corneal reflex, pupils dilated and non-reactive with no sedation medication on board.  On 05-Dec-2019 pts family decided to change pts code status to DNR and proceeded with one-way extubation.  The pt expired on December 05, 2019 at 2004/01/26 with multiple family members at bedside.    Pertinent Labs and Studies  Significant Diagnostic Studies DG Chest 1 View  Result Date: 2019-12-04 CLINICAL DATA:  ETT placement EXAM: CHEST  1 VIEW COMPARISON:  Radiograph same day 5:30 p.m. FINDINGS: The heart size and mediastinal contours are this is unchanged. ETT is seen 3 cm above the level of the carina. The NG tube is seen with the tip in the proximal stomach. Again noted are multifocal patchy airspace opacities throughout the right lung  and left lung base. IMPRESSION: ETT in satisfactory position 3 cm above the carina. NG tube within the proximal stomach. Unchanged bilateral multifocal airspace opacities. Electronically Signed    By: Jonna Clark M.D.   On: 11/14/2019 18:39   DG Chest 1 View  Result Date: 11/13/2019 CLINICAL DATA:  Intubation EXAM: CHEST  1 VIEW COMPARISON:  None. FINDINGS: Endotracheal tube in good position. Bilateral airspace disease consistent with pneumonia. No effusion. No pneumothorax. IMPRESSION: Endotracheal tube in good position. Diffuse bilateral pneumonia. Electronically Signed   By: Marlan Palau M.D.   On: 11/17/2019 18:02   DG Abdomen 1 View  Result Date: 11/12/2019 CLINICAL DATA:  NG tube placement EXAM: ABDOMEN - 1 VIEW COMPARISON:  None. FINDINGS: The tip the NG tube is seen within the proximal stomach. There is mildly prominent air-filled loops of small bowel seen within the mid abdomen. IMPRESSION: Tip the NG tube within the proximal stomach Electronically Signed   By: Jonna Clark M.D.   On: 11/02/2019 18:38   CT Head Wo Contrast  Result Date: 10/28/2019 CLINICAL DATA:  Cardiac arrest EXAM: CT HEAD WITHOUT CONTRAST TECHNIQUE: Contiguous axial images were obtained from the base of the skull through the vertex without intravenous contrast. COMPARISON:  None. FINDINGS: Brain: No acute intracranial hemorrhage is visualized. Patient is status post right frontal, parietal and temporal craniectomy. There is mild dural thickening. Hypodensity within the right temporal lobe, inferior frontal lobes and right frontal and parietal lobes underlying the craniectomy, likely due to evolving posttraumatic changes. Hypodensities within the left greater than right basal ganglia are suggestive of chronic lacunar infarcts. Generalized sulcal edema with diffuse loss of the gray-white matter differentiation. Bulging dura and brain contents at the craniotomy site on the right. About 4 mm midline shift to the right. Vascular: No hyperdense vessels.  No unexpected calcification Skull: Status post right craniectomy. Left frontal small burr holes. Comminuted left frontal bone fracture extending to the left orbital roof.  Sinuses/Orbits: Fluid levels in the sphenoid sinus and maxillary sinuses. Mucosal thickening in the ethmoid sinuses. Incompletely visualized facial bone fractures. There is fracture through the anterior ethmoid air cells. Right orbital roof fracture, extending to the superolateral orbital rim. Other: None IMPRESSION: 1. The patient is status post right craniectomy. There are some findings felt related to evolving posttraumatic changes at the bilateral frontal, right parietal and right temporal lobes. However there is generalized loss of the gray-white matter differentiation in addition to diffuse bilateral sulcal effacement, suspect for acute diffuse brain swelling and probable global hypoxic ischemic injury. About 4 mm midline shift to the right with bulging of dura and brain contents through the right craniectomy site. No hemorrhage is visualized. 2. Incompletely visualized fractures involving the left frontal bone, bilateral orbital roofs, and anterior ethmoid sinuses. Electronically Signed   By: Jasmine Pang M.D.   On: 11/04/2019 20:47   DG Chest Port 1 View  Result Date: 12/05/19 CLINICAL DATA:  Assess for interval change EXAM: PORTABLE CHEST 1 VIEW COMPARISON:  Radiograph 11/22/2019 FINDINGS: Endotracheal tube terminates in the mid trachea, 4 cm from the carina. Transesophageal tube tip and side port distal to the GE junction. Telemetry leads and pacer pads overlie the chest wall. Significant interval progression of the perihilar predominant opacities throughout both lungs with fissural and septal thickening. The pulmonary vascularity is indistinct. Cardiac silhouette is stable from prior accounting for differences in technique. No pneumothorax. No visible effusion. IMPRESSION: Significant interval worsening of dense perihilar opacities, worrisome for developing  alveolar pulmonary edema Electronically Signed   By: Kreg Shropshire M.D.   On: 26-Nov-2019 05:05    Microbiology Recent Results (from the past  240 hour(s))  Respiratory Panel by PCR     Status: None   Collection Time: 11/10/2019  6:30 PM   Specimen: Nasopharyngeal Swab; Respiratory  Result Value Ref Range Status   Adenovirus NOT DETECTED NOT DETECTED Final   Coronavirus 229E NOT DETECTED NOT DETECTED Final    Comment: (NOTE) The Coronavirus on the Respiratory Panel, DOES NOT test for the novel  Coronavirus (2019 nCoV)    Coronavirus HKU1 NOT DETECTED NOT DETECTED Final   Coronavirus NL63 NOT DETECTED NOT DETECTED Final   Coronavirus OC43 NOT DETECTED NOT DETECTED Final   Metapneumovirus NOT DETECTED NOT DETECTED Final   Rhinovirus / Enterovirus NOT DETECTED NOT DETECTED Final   Influenza A NOT DETECTED NOT DETECTED Final   Influenza B NOT DETECTED NOT DETECTED Final   Parainfluenza Virus 1 NOT DETECTED NOT DETECTED Final   Parainfluenza Virus 2 NOT DETECTED NOT DETECTED Final   Parainfluenza Virus 3 NOT DETECTED NOT DETECTED Final   Parainfluenza Virus 4 NOT DETECTED NOT DETECTED Final   Respiratory Syncytial Virus NOT DETECTED NOT DETECTED Final   Bordetella pertussis NOT DETECTED NOT DETECTED Final   Chlamydophila pneumoniae NOT DETECTED NOT DETECTED Final   Mycoplasma pneumoniae NOT DETECTED NOT DETECTED Final    Comment: Performed at [facility], [address], [place], [place] [address]  MRSA PCR Screening     Status: None   Collection Time: 11/07/2019  9:01 PM   Specimen: Nasopharyngeal  Result Value Ref Range Status   MRSA by PCR NEGATIVE NEGATIVE Final    Comment:        The GeneXpert MRSA Assay (FDA approved for NASAL specimens only), is one component of a comprehensive MRSA colonization surveillance program. It is not intended to diagnose MRSA infection nor to guide or monitor treatment for MRSA infections. Performed at [facility], [address] [place], [place] [address]   CULTURE, BLOOD (ROUTINE X 2) w Reflex to ID Panel     Status: None (Preliminary result)   Collection Time:  Nov 26, 2019  1:05 AM   Specimen: BLOOD  Result Value Ref Range Status   Specimen Description BLOOD RIGHT HAND  Final   Special Requests   Final    BOTTLES DRAWN AEROBIC ONLY Blood Culture adequate volume   Culture   Final    NO GROWTH < 12 HOURS Performed at [facility], [address], [place], [place] [address]    Report Status PENDING  Incomplete  CULTURE, BLOOD (ROUTINE X 2) w Reflex to ID Panel     Status: None (Preliminary result)   Collection Time: 11/26/19  1:15 AM   Specimen: BLOOD  Result Value Ref Range Status   Specimen Description BLOOD RIGHT WRIST  Final   Special Requests   Final    BOTTLES DRAWN AEROBIC ONLY Blood Culture results may not be optimal due to an inadequate volume of blood received in culture bottles   Culture   Final    NO GROWTH < 12 HOURS Performed at [facility], [address] [place], [place] [address]    Report Status PENDING  Incomplete  Respiratory Panel by RT PCR (Flu A&B, Covid) - Nasopharyngeal Swab     Status: None   Collection Time: 11/26/19  5:51 AM   Specimen: Nasopharyngeal Swab  Result Value Ref Range  Status   SARS Coronavirus 2 by RT PCR NEGATIVE NEGATIVE Final    Comment: (NOTE) SARS-CoV-2 target nucleic acids are NOT DETECTED. The SARS-CoV-2 RNA is generally detectable in upper respiratoy specimens during the acute phase of infection. The lowest concentration of SARS-CoV-2 viral copies this assay can detect is 131 copies/mL. A negative result does not preclude SARS-Cov-2 infection and should not be used as the sole basis for treatment or other patient management decisions. A negative result may occur with  improper specimen collection/handling, submission of specimen other than nasopharyngeal swab, presence of viral mutation(s) within the areas targeted by this assay, and inadequate number of viral copies (<131 copies/mL). A negative result must be combined with clinical observations, patient history, and  epidemiological information. The expected result is Negative. Fact Sheet for Patients:  PinkCheek.be Fact Sheet for Healthcare Providers:  GravelBags.it This test is not yet ap proved or cleared by the [place] FDA and  has been authorized for detection and/or diagnosis of SARS-CoV-2 by FDA under an Emergency Use Authorization (EUA). This EUA will remain  in effect (meaning this test can be used) for the duration of the COVID-19 declaration under Section 564(b)(1) of the Act, 21 U.S.C. section 360bbb-3(b)(1), unless the authorization is terminated or revoked sooner.    Influenza A by PCR NEGATIVE NEGATIVE Final   Influenza B by PCR NEGATIVE NEGATIVE Final    Comment: (NOTE) The Xpert Xpress SARS-CoV-2/FLU/RSV assay is intended as an aid in  the diagnosis of influenza from Nasopharyngeal swab specimens and  should not be used as a sole basis for treatment. Nasal washings and  aspirates are unacceptable for Xpert Xpress SARS-CoV-2/FLU/RSV  testing. Fact Sheet for Patients: PinkCheek.be Fact Sheet for Healthcare Providers: GravelBags.it This test is not yet approved or cleared by the [place] FDA and  has been authorized for detection and/or diagnosis of SARS-CoV-2 by  FDA under an Emergency Use Authorization (EUA). This EUA will remain  in effect (meaning this test can be used) for the duration of the  Covid-19 declaration under Section 564(b)(1) of the Act, 21  U.S.C. section 360bbb-3(b)(1), unless the authorization is  terminated or revoked. Performed at [facility], [address] [place], [place] [address]     Lab Basic Metabolic Panel: Recent Labs  Lab 11/11/2019 2119 16-Dec-2019 0105 Dec 16, 2019 0118 2019-12-16 0422  NA 164*  --  164* 164*  K 5.4*  --  3.8 3.4*  CL 126*  --  124* 123*  CO2 19*  --  21* 25  GLUCOSE 319*  --  206* 152*  BUN  44*  --  46* 44*  CREATININE 2.00*  --  1.90* 2.06*  CALCIUM 7.8*  --  7.7* 7.1*  MG  --  2.4  --   --   PHOS  --  7.4*  --   --    Liver Function Tests: Recent Labs  Lab 11/02/2019 2119 16-Dec-2019 0422  AST 373* 407*  ALT 149* 110*  ALKPHOS 182* 123  BILITOT 0.9 0.7  PROT 5.3* 5.0*  ALBUMIN 2.5* 2.4*   No results for input(s): LIPASE, AMYLASE in the last 168 hours. No results for input(s): AMMONIA in the last 168 hours. CBC: Recent Labs  Lab 10/27/2019 2119 12/16/2019 0118 2019-12-16 0422 Dec 16, 2019 1052  WBC 4.3 1.4* 1.6* 2.5*  NEUTROABS 1.7 0.6* 0.7* 1.3*  HGB 5.2* 10.9* 10.3* 7.4*  HCT 16.5* 33.0* 31.4* 21.5*  MCV 101.2* 93.0 92.6 90.3  PLT 96* 63* 133*  76*   Cardiac Enzymes: No results for input(s): CKTOTAL, CKMB, CKMBINDEX, TROPONINI in the last 168 hours. Sepsis Labs: Recent Labs  Lab 11/21/2019 2119 Dec 03, 2019 0118 12-03-2019 0422 12-03-2019 1052  WBC 4.3 1.4* 1.6* 2.5*    Procedures/Operations  Mechanical Intubation  Left femoral central line placement  Sonda Rumble, AGNP  Pulmonary/Critical Care Pager [phone] (please enter 7 digits) PCCM Consult Pager [phone] (please enter 7 digits)

## 2019-11-24 NOTE — Progress Notes (Signed)
Pts son [place] called the ICU, I informed him his father is not doing well and his family needs to come to the hospital between 08:30 am to 09:00 am today to discuss goals of treatment/plan of care with a Spanish Interpretor present.  He did inform me he speaks very limited [place].  Mr. Brent Levy said "ok."  Will continue to monitor and assess pt.  Sonda Rumble, AGNP  Pulmonary/Critical Care Pager [phone] (please enter 7 digits) PCCM Consult Pager [phone] (please enter 7 digits)

## 2019-11-24 DEATH — deceased

## 2020-04-06 IMAGING — DX DG ABDOMEN 1V
1 series · 1 of 1 positions shown · non-contrast
Comparison: None.

CLINICAL DATA: NG tube placement

EXAM:
ABDOMEN - 1 VIEW

[abdomen supine]
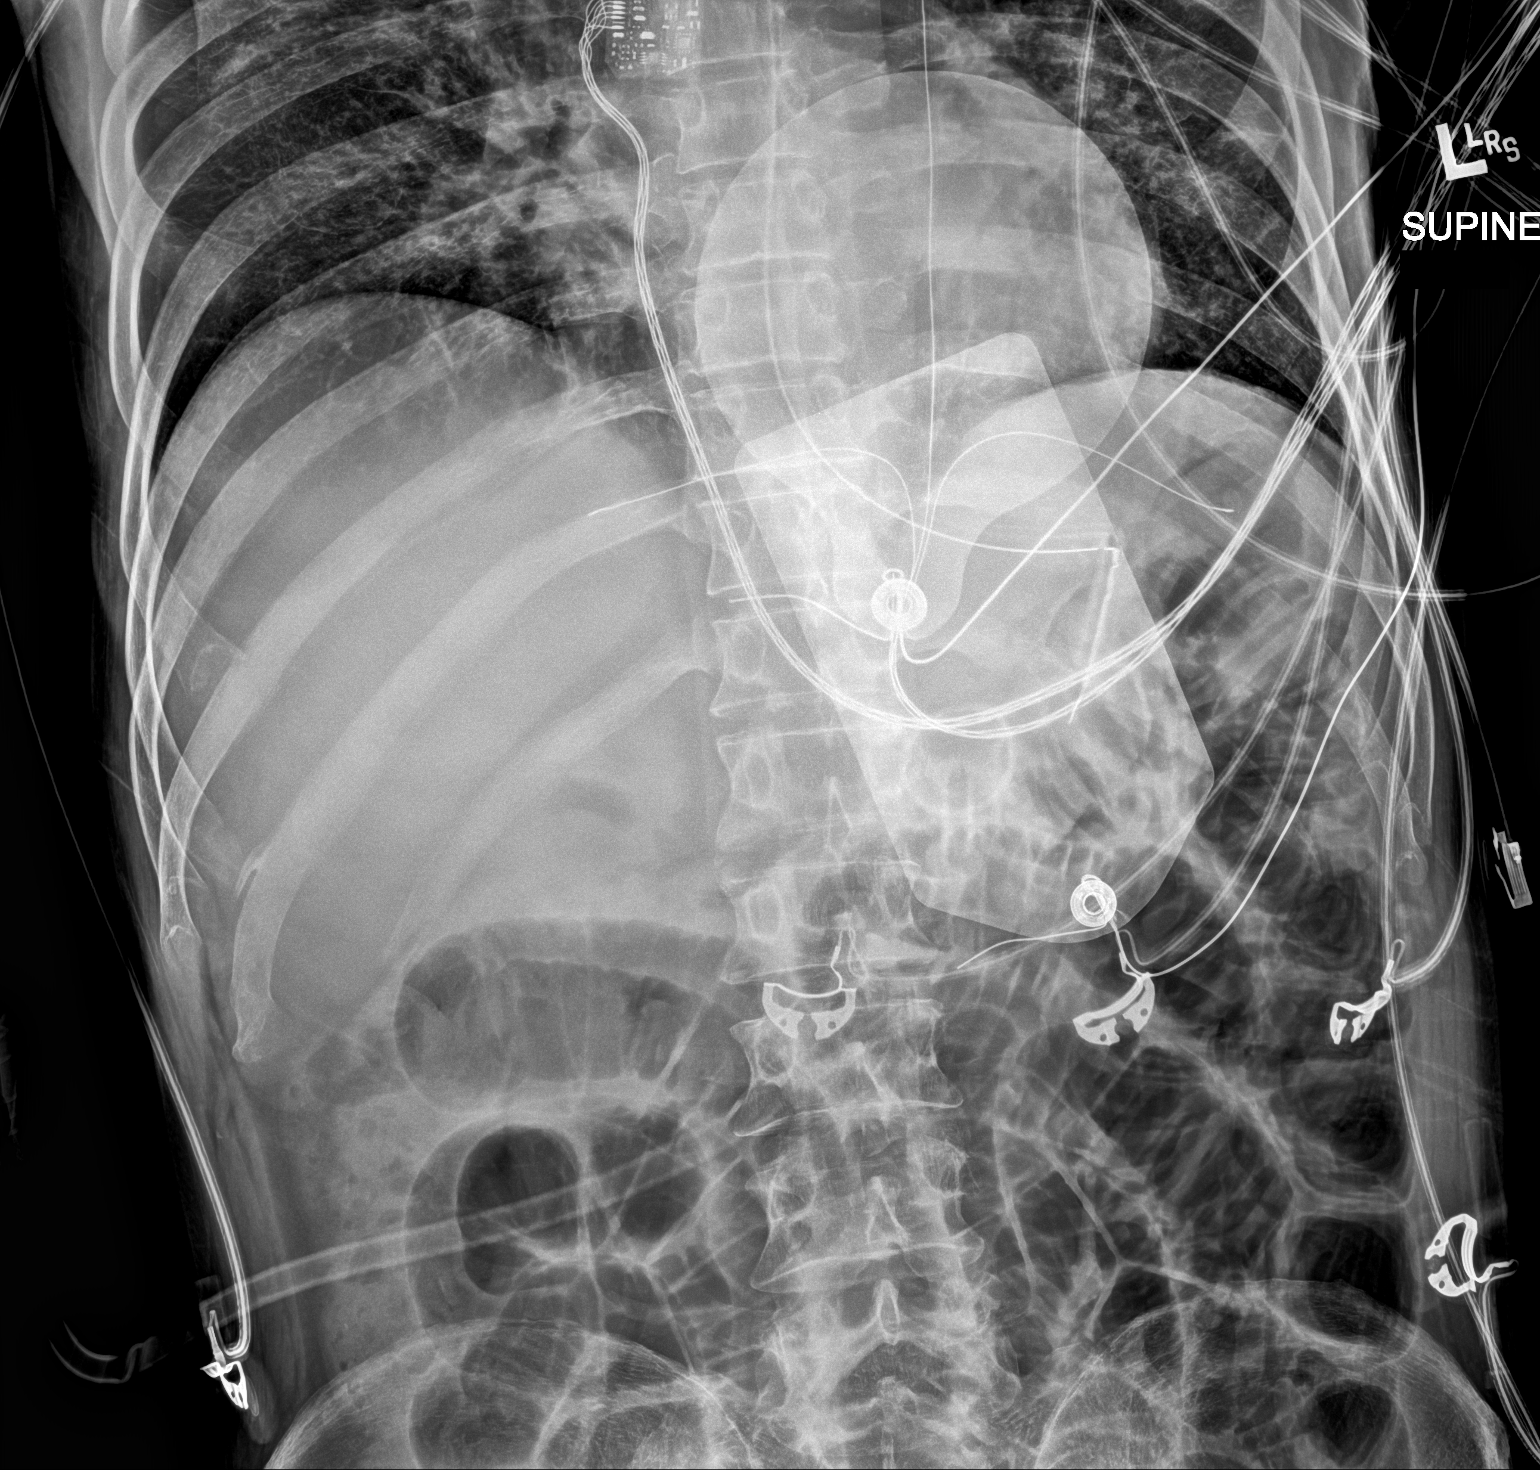

[1 of 1 positions shown; findings below may reference images not displayed]

FINDINGS: The tip the NG tube is seen within the proximal stomach. There is
mildly prominent air-filled loops of small bowel seen within the mid
abdomen.
IMPRESSION: Tip the NG tube within the proximal stomach
